# Patient Record
Sex: Female | Born: 1955 | Race: White | Hispanic: No | Marital: Married | Smoking: Never smoker
Health system: Southern US, Community
[De-identification: ages and names within clinical notes are randomized; demographics above are authoritative.]

## PROBLEM LIST (undated history)

## (undated) DIAGNOSIS — I2510 Atherosclerotic heart disease of native coronary artery without angina pectoris: Secondary | ICD-10-CM

## (undated) DIAGNOSIS — I350 Nonrheumatic aortic (valve) stenosis: Secondary | ICD-10-CM

## (undated) DIAGNOSIS — I10 Essential (primary) hypertension: Secondary | ICD-10-CM

## (undated) DIAGNOSIS — T8859XA Other complications of anesthesia, initial encounter: Secondary | ICD-10-CM

## (undated) DIAGNOSIS — R112 Nausea with vomiting, unspecified: Secondary | ICD-10-CM

## (undated) DIAGNOSIS — Z9889 Other specified postprocedural states: Secondary | ICD-10-CM

## (undated) HISTORY — PX: OTHER SURGICAL HISTORY: SHX169

## (undated) HISTORY — PX: TONSILLECTOMY: SUR1361

## (undated) HISTORY — PX: ABDOMINAL HYSTERECTOMY: SHX81

## (undated) HISTORY — PX: COLONOSCOPY: SHX174

---

## 2002-02-19 ENCOUNTER — Encounter: Admission: RE | Admit: 2002-02-19 | Discharge: 2002-02-19 | Payer: Self-pay | Admitting: Obstetrics and Gynecology

## 2002-02-19 ENCOUNTER — Encounter: Payer: Self-pay | Admitting: Obstetrics and Gynecology

## 2003-03-29 ENCOUNTER — Ambulatory Visit (HOSPITAL_COMMUNITY): Admission: RE | Admit: 2003-03-29 | Discharge: 2003-03-29 | Payer: Self-pay | Admitting: Family Medicine

## 2004-10-16 ENCOUNTER — Ambulatory Visit (HOSPITAL_COMMUNITY): Admission: RE | Admit: 2004-10-16 | Discharge: 2004-10-16 | Payer: Self-pay | Admitting: General Surgery

## 2014-02-08 ENCOUNTER — Emergency Department (HOSPITAL_COMMUNITY)
Admission: EM | Admit: 2014-02-08 | Discharge: 2014-02-08 | Disposition: A | Discharge status: Home / Self Care | Payer: BC Managed Care – PPO | Attending: Emergency Medicine | Admitting: Emergency Medicine

## 2014-02-08 ENCOUNTER — Encounter (HOSPITAL_COMMUNITY): Payer: Self-pay | Admitting: Emergency Medicine

## 2014-02-08 ENCOUNTER — Emergency Department (HOSPITAL_COMMUNITY): Payer: BC Managed Care – PPO

## 2014-02-08 DIAGNOSIS — R011 Cardiac murmur, unspecified: Secondary | ICD-10-CM | POA: Diagnosis not present

## 2014-02-08 DIAGNOSIS — Z79899 Other long term (current) drug therapy: Secondary | ICD-10-CM | POA: Insufficient documentation

## 2014-02-08 DIAGNOSIS — R079 Chest pain, unspecified: Secondary | ICD-10-CM | POA: Insufficient documentation

## 2014-02-08 DIAGNOSIS — Z7982 Long term (current) use of aspirin: Secondary | ICD-10-CM | POA: Insufficient documentation

## 2014-02-08 DIAGNOSIS — I2510 Atherosclerotic heart disease of native coronary artery without angina pectoris: Secondary | ICD-10-CM | POA: Diagnosis not present

## 2014-02-08 HISTORY — DX: Atherosclerotic heart disease of native coronary artery without angina pectoris: I25.10

## 2014-02-08 LAB — CBC WITH DIFFERENTIAL/PLATELET
BASOS PCT: 1 % (ref 0–1)
Basophils Absolute: 0.1 10*3/uL (ref 0.0–0.1)
EOS ABS: 0.2 10*3/uL (ref 0.0–0.7)
EOS PCT: 3 % (ref 0–5)
HCT: 44.5 % (ref 36.0–46.0)
Hemoglobin: 15.7 g/dL — ABNORMAL HIGH (ref 12.0–15.0)
LYMPHS ABS: 2.4 10*3/uL (ref 0.7–4.0)
Lymphocytes Relative: 30 % (ref 12–46)
MCH: 32 pg (ref 26.0–34.0)
MCHC: 35.3 g/dL (ref 30.0–36.0)
MCV: 90.6 fL (ref 78.0–100.0)
Monocytes Absolute: 0.7 10*3/uL (ref 0.1–1.0)
Monocytes Relative: 9 % (ref 3–12)
Neutro Abs: 4.8 10*3/uL (ref 1.7–7.7)
Neutrophils Relative %: 57 % (ref 43–77)
PLATELETS: 243 10*3/uL (ref 150–400)
RBC: 4.91 MIL/uL (ref 3.87–5.11)
RDW: 12.5 % (ref 11.5–15.5)
WBC: 8.2 10*3/uL (ref 4.0–10.5)

## 2014-02-08 LAB — COMPREHENSIVE METABOLIC PANEL
ALBUMIN: 4.5 g/dL (ref 3.5–5.2)
ALT: 26 U/L (ref 0–35)
AST: 23 U/L (ref 0–37)
Alkaline Phosphatase: 80 U/L (ref 39–117)
Anion gap: 15 (ref 5–15)
BILIRUBIN TOTAL: 0.7 mg/dL (ref 0.3–1.2)
BUN: 13 mg/dL (ref 6–23)
CO2: 27 mEq/L (ref 19–32)
Calcium: 10 mg/dL (ref 8.4–10.5)
Chloride: 103 mEq/L (ref 96–112)
Creatinine, Ser: 1.08 mg/dL (ref 0.50–1.10)
GFR calc Af Amer: 65 mL/min — ABNORMAL LOW (ref >90)
GFR, EST NON AFRICAN AMERICAN: 56 mL/min — AB (ref >90)
Glucose, Bld: 110 mg/dL — ABNORMAL HIGH (ref 70–99)
Potassium: 3.8 mEq/L (ref 3.7–5.3)
Sodium: 145 mEq/L (ref 137–147)
Total Protein: 8.2 g/dL (ref 6.0–8.3)

## 2014-02-08 LAB — D-DIMER, QUANTITATIVE: D-Dimer, Quant: 0.33 ug/mL-FEU (ref 0.00–0.48)

## 2014-02-08 LAB — TROPONIN I

## 2014-02-08 NOTE — Discharge Instructions (Signed)

## 2014-02-08 NOTE — ED Provider Notes (Signed)
CSN: [id number]     Arrival date & time 02/08/14  1756 History   First MD Initiated Contact with Patient 02/08/14 1859     This chart was scribed for Jane Horn, MD by Tonye Royalty, ED Scribe. This patient was seen in room APA05/APA05 and the patient's care was started at 7:02 PM.   Chief Complaint  Patient presents with  . Chest Pain   The history is provided by the patient. No language interpreter was used.   HPI Comments: Jane Conley is a 58 y.o. female who presents to the Emergency Department complaining of intermittent chest pain with onset a few weeks ago. She reports intermittent, non-radiating chest tightness that lasts a few hours and occur approximately 3 times a week; she states these episodes occur at night and are not associated with exertion. She reports an episode last night that had associated shortness of breath and diaphoresis but denies these at other occasions. She reports general fatigue for the past few months, worse today. She also reports intermittent episodes of tachycardia and skipped heartbeats for approximately 10 minutes with onset a few weeks ago. She denies history of blood clots or heart attacks. She denies fever, cough, nausea, vomiting, speech changes, or one-sided weakness.  Past Medical History  Diagnosis Date  . Coronary artery disease    Past Surgical History  Procedure Laterality Date  . Abdominal hysterectomy    . Leaking aortic valve     History reviewed. No pertinent family history. History  Substance Use Topics  . Smoking status: Never Smoker   . Smokeless tobacco: Not on file  . Alcohol Use: No   OB History   Grav Para Term Preterm Abortions TAB SAB Ect Mult Living                 Review of Systems 10 Systems reviewed and are negative for acute change except as noted in the HPI.   Allergies  Codeine  Home Medications   Prior to Admission medications   Medication Sig Start Date End Date Taking? Authorizing Provider  aspirin EC  81 MG tablet Take 81 mg by mouth every morning.   Yes Historical Provider, MD  ibuprofen (ADVIL,MOTRIN) 200 MG tablet Take 200 mg by mouth every 6 (six) hours as needed for headache.   Yes Historical Provider, MD  losartan-hydrochlorothiazide (HYZAAR) 100-25 MG per tablet Take 1 tablet by mouth daily.   Yes Historical Provider, MD  Multiple Vitamin (MULTIVITAMIN WITH MINERALS) TABS tablet Take 1 tablet by mouth every morning.   Yes Historical Provider, MD   BP 133/79  Pulse 91  Temp(Src) 98.1 F (36.7 C) (Oral)  Resp 20  Ht  (1.727 m)  Wt 200 lb (90.719 kg)  BMI 30.42 kg/m2  SpO2 99% Physical Exam  Nursing note and vitals reviewed. Constitutional:  Awake, alert, nontoxic appearance.  HENT:  Head: Atraumatic.  Eyes: Right eye exhibits no discharge. Left eye exhibits no discharge.  Neck: Neck supple.  Cardiovascular: Normal rate and regular rhythm.   Murmur (soft systolic murmur present) heard. Pulmonary/Chest: Effort normal and breath sounds normal. No respiratory distress. She has no wheezes. She has no rales. She exhibits no tenderness.  Oxygen sat normal on room air 98%  Abdominal: Soft. There is no tenderness. There is no rebound.  Musculoskeletal: She exhibits no edema and no tenderness.  Baseline ROM, no obvious new focal weakness.  Neurological:  Mental status and motor strength appears baseline for patient and situation.  Skin: No rash noted.  Psychiatric: She has a normal mood and affect.    ED Course  Procedures (including critical care time) Patient informed of clinical course, understand medical decision-making process, and agree with plan. Labs Review Labs Reviewed  CBC WITH DIFFERENTIAL - Abnormal; Notable for the following:    Hemoglobin 15.7 (*)    All other components within normal limits  COMPREHENSIVE METABOLIC PANEL - Abnormal; Notable for the following:    Glucose, Bld 110 (*)    GFR calc non Af Amer 56 (*)    GFR calc Af Amer 65 (*)    All  other components within normal limits  TROPONIN I  D-DIMER, QUANTITATIVE    Imaging Review No results found. Dg Chest 2 View  02/08/2014   CLINICAL DATA:  Chest pain.  The patient feels weak today.  EXAM: CHEST  2 VIEW  COMPARISON:  None  FINDINGS: Heart size is normal. Aorta is mildly tortuous. There are no focal consolidations or pleural effusions. No pulmonary edema. Mild upper thoracic degenerative changes are present.  IMPRESSION: No evidence for acute  abnormality.   Electronically Signed   By: Rosalie Gums M.D.   On: 02/08/2014 18:23   EKG Interpretation   Date/Time:  Monday February 08 2014 17:59:25 EDT Ventricular Rate:  103 PR Interval:  180 QRS Duration: 112 QT Interval:  370 QTC Calculation: 484 R Axis:   -58 Text Interpretation:  Sinus tachycardia Left anterior fascicular block  Left ventricular hypertrophy with repolarization abnormality Cannot rule  out Septal infarct , age undetermined No previous ECGs available Confirmed  by [facility]  MD, Jonny Ruiz ([id number]) on 02/08/2014 7:00:48 PM      MDM   Final diagnoses:  Chest pain, unspecified chest pain type   I personally performed the services described in this documentation, which was scribed in my presence. The recorded information has been reviewed and is accurate. I doubt any other EMC precluding discharge at this time including, but not necessarily limited to the following:AMI, PE, Vtach.     Jane Horn, MD 02/10/14 [phone]

## 2014-02-08 NOTE — ED Notes (Signed)
Chest pain , last night , feels weak today

## 2014-05-25 ENCOUNTER — Encounter: Payer: Self-pay | Admitting: *Deleted

## 2014-05-25 DIAGNOSIS — F329 Major depressive disorder, single episode, unspecified: Secondary | ICD-10-CM | POA: Insufficient documentation

## 2014-05-25 DIAGNOSIS — I10 Essential (primary) hypertension: Secondary | ICD-10-CM | POA: Insufficient documentation

## 2014-05-25 DIAGNOSIS — E6609 Other obesity due to excess calories: Secondary | ICD-10-CM | POA: Insufficient documentation

## 2014-05-25 DIAGNOSIS — E785 Hyperlipidemia, unspecified: Secondary | ICD-10-CM | POA: Insufficient documentation

## 2014-05-25 DIAGNOSIS — F32A Depression, unspecified: Secondary | ICD-10-CM | POA: Insufficient documentation

## 2014-05-25 DIAGNOSIS — F419 Anxiety disorder, unspecified: Secondary | ICD-10-CM

## 2014-05-25 DIAGNOSIS — J069 Acute upper respiratory infection, unspecified: Secondary | ICD-10-CM | POA: Insufficient documentation

## 2014-05-26 ENCOUNTER — Encounter: Payer: Self-pay | Admitting: Internal Medicine

## 2014-05-26 ENCOUNTER — Ambulatory Visit (INDEPENDENT_AMBULATORY_CARE_PROVIDER_SITE_OTHER): Payer: BC Managed Care – PPO | Admitting: Internal Medicine

## 2014-05-26 VITALS — BP 158/98 | HR 83 | Ht 68.0 in | Wt 209.0 lb

## 2014-05-26 DIAGNOSIS — I359 Nonrheumatic aortic valve disorder, unspecified: Secondary | ICD-10-CM

## 2014-05-26 NOTE — Patient Instructions (Signed)
Your physician wants you to follow-up in: 1 year You will receive a reminder letter in the mail two months in advance. If you don't receive a letter, please call our office to schedule the follow-up appointment.     Your physician recommends that you continue on your current medications as directed. Please refer to the Current Medication list given to you today.     Your physician has requested that you have an echocardiogram. Echocardiography is a painless test that uses sound waves to create images of your heart. It provides your doctor with information about the size and shape of your heart and how well your heart's chambers and valves are working. This procedure takes approximately one hour. There are no restrictions for this procedure.      Thank you for choosing [facility] !        

## 2014-05-26 NOTE — Progress Notes (Signed)
HPI Patinet was seen in past by J Jauca in past for murmur  Had echoes done in past  .  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  This was a couple months ago  EKG done  Abnormal  Felt due to  anxiety   Went to  ER in September.  After that got better.   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 Codeine Nausea And Vomiting and Swelling    Current Outpatient Prescriptions  Medication Sig Dispense Refill  . aspirin EC 81 MG tablet Take 81 mg by mouth every morning.    Marland Kitchen. ibuprofen (ADVIL,MOTRIN) 200 MG tablet Take 200 mg by mouth every 6 (six) hours as needed for headache.    . losartan-hydrochlorothiazide (HYZAAR) 100-25 MG per tablet Take 0.5 tablets by mouth daily.     . Multiple Vitamin (MULTIVITAMIN WITH MINERALS) TABS tablet Take 1 tablet by mouth every morning.     No current facility-administered medications for this visit.    Past Medical History  Diagnosis Date  . Coronary artery disease     Past Surgical History  Procedure Laterality Date  . Abdominal hysterectomy    . Leaking aortic valve      No family history on file.  History   Social History  . Marital Status: Married    Spouse Name: N/A    Number of Children: N/A  . Years of Education: N/A   Occupational History  . Not on file.   Social History Main Topics  . Smoking status: Never Smoker   . Smokeless tobacco: Not on file  . Alcohol Use: No  . Drug Use: No  . Sexual Activity: Yes    Birth Control/ Protection: Surgical   Other Topics Concern  . Not on file   Social History Narrative    Review of Systems:   All systems reviewed.  They are negative to the above problem except as previously stated.  Vital Signs: BP 158/98 mmHg  Pulse 83  Ht 5\' 8"  (1.727 m)  Wt 209 lb (94.802 kg)  BMI 31.79 kg/m2  Physical Exam  HEENT:  Normocephalic, atraumatic. EOMI, PERRLA.  Neck: JVP is normal.  No bruits.  Lungs: clear to auscultation. No rales no wheezes.  Heart: Regular rate and rhythm. Normal S1, S2. No S3.   Gr III/VI systolic murmur base   PMI not displaced.  Abdomen:  Supple, nontender. Normal bowel sounds. No masses. No hepatomegaly.  Extremities:   Good distal pulses throughout. No lower extremity edema.  Musculoskeletal :moving all extremities.  Neuro:   alert and oriented x3.  CN II-XII grossly intact.  EKG  02/09/14:  ST 103 bpm  LVH with repol abnormality  Assessment and Plan:  1.  AV disease.  Patient appears asymptomatic  Volume good  Needs to have echo to reeval valve WIll get old records.  2. Palpitations.  If recurs will set up for event monitor.  I am not convinced they represent a signif arrhythmia  3  HTN  BP is high  3.  HCM  LDL was 130  Patient knows she has to lose wt  Lost 50 lb in past with [employer].

## 2014-05-31 ENCOUNTER — Ambulatory Visit (HOSPITAL_COMMUNITY)
Admission: RE | Admit: 2014-05-31 | Discharge: 2014-05-31 | Disposition: A | Discharge status: Home / Self Care | Payer: BC Managed Care – PPO | Source: Ambulatory Visit | Attending: Internal Medicine | Admitting: Internal Medicine

## 2014-05-31 DIAGNOSIS — R002 Palpitations: Secondary | ICD-10-CM | POA: Diagnosis not present

## 2014-05-31 DIAGNOSIS — R011 Cardiac murmur, unspecified: Secondary | ICD-10-CM | POA: Diagnosis not present

## 2014-05-31 DIAGNOSIS — I359 Nonrheumatic aortic valve disorder, unspecified: Secondary | ICD-10-CM

## 2014-05-31 DIAGNOSIS — E669 Obesity, unspecified: Secondary | ICD-10-CM | POA: Insufficient documentation

## 2014-05-31 DIAGNOSIS — I10 Essential (primary) hypertension: Secondary | ICD-10-CM | POA: Diagnosis not present

## 2014-05-31 DIAGNOSIS — E785 Hyperlipidemia, unspecified: Secondary | ICD-10-CM | POA: Insufficient documentation

## 2014-05-31 DIAGNOSIS — I352 Nonrheumatic aortic (valve) stenosis with insufficiency: Secondary | ICD-10-CM | POA: Diagnosis present

## 2014-05-31 NOTE — Progress Notes (Signed)
  Echocardiogram 2D Echocardiogram has been performed.  Jane Conley 05/31/2014, 2:06 PM

## 2014-06-07 ENCOUNTER — Telehealth: Payer: Self-pay | Admitting: Internal Medicine

## 2014-06-07 NOTE — Telephone Encounter (Signed)
Pt calling for echo results done 05/31/14

## 2014-06-07 NOTE — Telephone Encounter (Signed)
Results of echo / tgs  °

## 2014-06-08 ENCOUNTER — Other Ambulatory Visit: Payer: Self-pay

## 2014-06-08 DIAGNOSIS — I350 Nonrheumatic aortic (valve) stenosis: Secondary | ICD-10-CM

## 2014-06-09 ENCOUNTER — Telehealth: Payer: Self-pay | Admitting: Internal Medicine

## 2014-06-09 NOTE — Telephone Encounter (Signed)
Results of echo / called on 1/11/6 / no response / tgs

## 2014-06-10 ENCOUNTER — Telehealth: Payer: Self-pay

## 2014-06-10 DIAGNOSIS — I77810 Thoracic aortic ectasia: Secondary | ICD-10-CM

## 2014-06-10 DIAGNOSIS — Z01818 Encounter for other preprocedural examination: Secondary | ICD-10-CM

## 2014-06-10 NOTE — Telephone Encounter (Signed)
Dr Tenny Crawoss resulted echo and has now sent it to us.i have LM for pt to call back

## 2014-06-10 NOTE — Telephone Encounter (Signed)
pt made aware of echo results,will need chest ct,placed lab slip for BUN/Creat,chest CT order placed

## 2014-06-10 NOTE — Telephone Encounter (Signed)
-----   Message from Pricilla RifflePaula Ross V, MD sent at 06/08/2014 10:38 AM EST ----- Aortic valve appears moderately narrowed (stenotic ) now and it has mod insufficiency. The pumping funciton of the heart is normal Above the valve the ascending aorta is dilated   Difficult to see well, measure accuratedly Would recomm CT of chest to define clearly the size of the aorta She should also have f/u in clinic in September with echo prior

## 2014-06-17 ENCOUNTER — Telehealth: Payer: Self-pay

## 2014-06-17 ENCOUNTER — Other Ambulatory Visit: Payer: Self-pay | Admitting: Internal Medicine

## 2014-06-17 NOTE — Telephone Encounter (Signed)
Pt is aware she needs to have BUN/Creat drawn before chest CT.i have spoken to patient twice now and she assures me she is going to get lab work

## 2014-06-18 LAB — BUN: BUN: 13 mg/dL (ref 6–23)

## 2014-06-18 LAB — BUN+CREAT: BUN/Creatinine Ratio: 11.3 Ratio

## 2014-06-18 LAB — CREATININE, SERUM: CREATININE: 1.15 mg/dL — AB (ref 0.50–1.10)

## 2014-06-24 ENCOUNTER — Telehealth: Payer: Self-pay

## 2014-06-24 NOTE — Telephone Encounter (Signed)
OK to have chest CT with constrast per Dr Halford Chessmanoss Terry Stewart aware to schedule

## 2014-06-24 NOTE — Telephone Encounter (Signed)
-----   Message from Nori Riisatherine A Carlton, RN sent at 06/10/2014  4:57 PM EST ----- Regarding: labs for chest ct Labs for chest ct

## 2014-07-01 ENCOUNTER — Ambulatory Visit (HOSPITAL_COMMUNITY)
Admission: RE | Admit: 2014-07-01 | Discharge: 2014-07-01 | Disposition: A | Discharge status: Home / Self Care | Payer: BC Managed Care – PPO | Source: Ambulatory Visit | Attending: Internal Medicine | Admitting: Internal Medicine

## 2014-07-01 DIAGNOSIS — I352 Nonrheumatic aortic (valve) stenosis with insufficiency: Secondary | ICD-10-CM | POA: Insufficient documentation

## 2014-07-01 DIAGNOSIS — I77810 Thoracic aortic ectasia: Secondary | ICD-10-CM

## 2014-07-01 DIAGNOSIS — R079 Chest pain, unspecified: Secondary | ICD-10-CM | POA: Diagnosis not present

## 2014-07-01 DIAGNOSIS — I712 Thoracic aortic aneurysm, without rupture: Secondary | ICD-10-CM | POA: Diagnosis present

## 2014-07-01 MED ORDER — IOHEXOL 350 MG/ML SOLN
100.0000 mL | Freq: Once | INTRAVENOUS | Status: AC | PRN
  Administered 2014-07-01: 100 mL via INTRAVENOUS

## 2014-07-05 ENCOUNTER — Telehealth: Payer: Self-pay | Admitting: Cardiovascular Disease

## 2014-07-05 DIAGNOSIS — Z01818 Encounter for other preprocedural examination: Secondary | ICD-10-CM

## 2014-07-05 DIAGNOSIS — I7121 Aneurysm of the ascending aorta, without rupture: Secondary | ICD-10-CM

## 2014-07-05 DIAGNOSIS — I712 Thoracic aortic aneurysm, without rupture: Secondary | ICD-10-CM

## 2014-07-05 NOTE — Telephone Encounter (Signed)
Test results/tgs

## 2014-07-05 NOTE — Telephone Encounter (Signed)
Forward to Dr Tenny Crawoss for results of chest CT

## 2014-07-06 NOTE — Telephone Encounter (Signed)
Pt notified, will message Sima Mataserry Stewart to schedule chest CT in October(order placed),will place order for creatinine also

## 2014-07-06 NOTE — Telephone Encounter (Signed)
Note done

## 2014-07-06 NOTE — Telephone Encounter (Signed)
Reviewed CT of chest Ascending aorta measures 4.5 cm That is mild dilation   I would recomm repeat CT next October to see for any progression.  If stable would back down a little bit on frequenc6

## 2014-07-07 ENCOUNTER — Telehealth: Payer: Self-pay | Admitting: *Deleted

## 2014-07-07 NOTE — Telephone Encounter (Signed)
Pt had test results called to her and she has some questions

## 2014-07-07 NOTE — Telephone Encounter (Signed)
Does patient need dental prophy ? If so please give order so I can call into pharmacy for her

## 2014-07-16 ENCOUNTER — Telehealth: Payer: Self-pay | Admitting: *Deleted

## 2014-07-16 MED ORDER — AMOXICILLIN 500 MG PO TABS: ORAL_TABLET | ORAL | Status: DC

## 2014-07-16 NOTE — Telephone Encounter (Signed)
Pricilla RifflePaula Ross V, MD at 07/16/2014 10:45 AM     Status: Signed       Expand All Collapse All   I would go ahead and treat. With 2 g amoxicillin as long as she has taken before and not had a problem It is being extra careful to do            Nori Riisatherine A Carlton, RN at 07/07/2014 9:56 AM     Status: Signed       Expand All Collapse All   Does patient need dental prophy ? If so please give order so I can call into pharmacy for her            San Jettyanya M Jackson at 07/07/2014 9:28 AM     Status: Signed       Expand All Collapse All   Pt had test results called to her and she has some questions

## 2014-07-16 NOTE — Telephone Encounter (Signed)
Informed patient ok to taken prophylactic antibiotic.  She will pick up at her pharmacy to have on hand.  Also asked about recent cta results.  Reviewed Dr. Charlott Rakesoss's result note with patient. She has appointment with PCP coming up and requested copy sent to him.  Routed report to Dr. Margo AyeHall.  Per patient her BP was elevated at last office visit. She started taking a whole tab of Hyzaar. Her BP is improved -120s/70s, and she notices her ankles are less swollen.  She gets a little light headed in the evenings tho.

## 2014-07-16 NOTE — Telephone Encounter (Signed)
I would go ahead and treat. With 2 g amoxicillin as long as she has taken before and not had a problem It is being extra careful to do

## 2014-07-19 NOTE — Telephone Encounter (Signed)
Keep following BP

## 2014-07-21 NOTE — Telephone Encounter (Signed)
Called patient to let her know Dr. Tenny Crawoss states to keep following BP. Pt states she went to her PCP and BP was high because she had backed dose back to 1/2 Hyzaar because she was feeling lightheaded.  He changed her medication to losartan 100 mg daily and hctz 12.5 mg as needed for swelling. Med list adjusted accordingly.

## 2015-06-27 IMAGING — CT CT ANGIO CHEST
1 of 6 series · 5 of 36 positions shown · IV contrast (Omnipaque 300)
Comparison: Results from an echocardiogram on 05/31/2014

CLINICAL DATA: Aortic valve stenosis, aortic insufficiency and
chest pain. Echocardiography demonstrated probable dilatation of the
proximal ascending aorta.

EXAM:
CT ANGIOGRAPHY CHEST WITH CONTRAST
TECHNIQUE: Multidetector CT imaging of the chest was performed using the
standard protocol during bolus administration of intravenous
contrast. Multiplanar CT image reconstructions and MIPs were
obtained to evaluate the vascular anatomy.
CONTRAST:  100mL OMNIPAQUE IOHEXOL 350 MG/ML SOLN

[Series 4: pe 3.0 b40f · axial · 0.59mm/px · z∈[-342,-166]mm · 5 of 89 slices shown]
[im 15/89  lung]
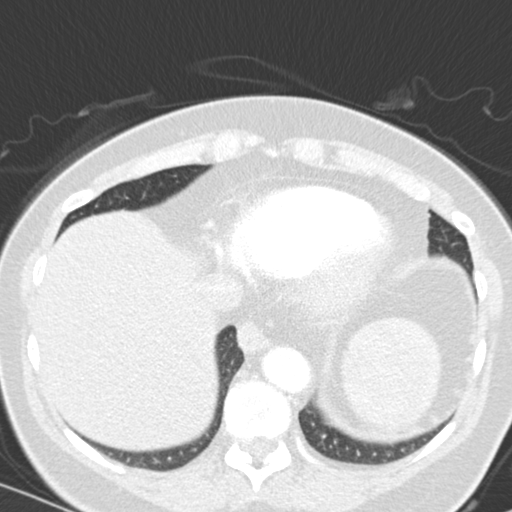
[im 30/89  mediastinal]
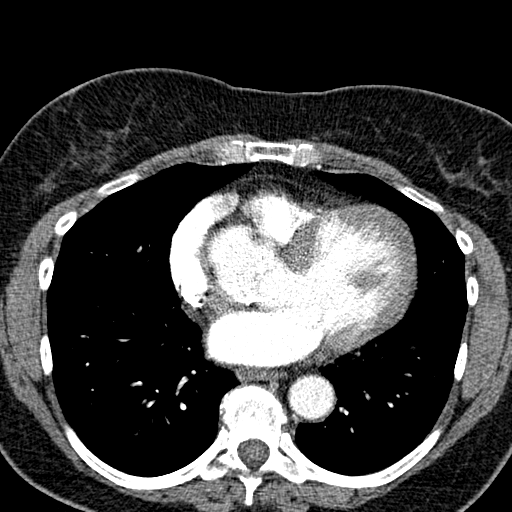
[im 45/89  lung]
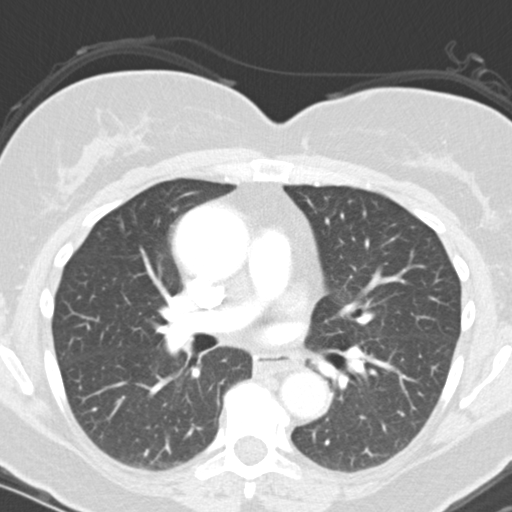
[im 59/89  mediastinal]
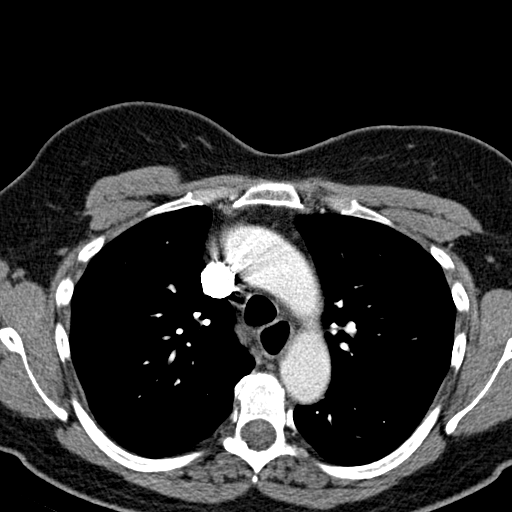
[im 74/89  lung]
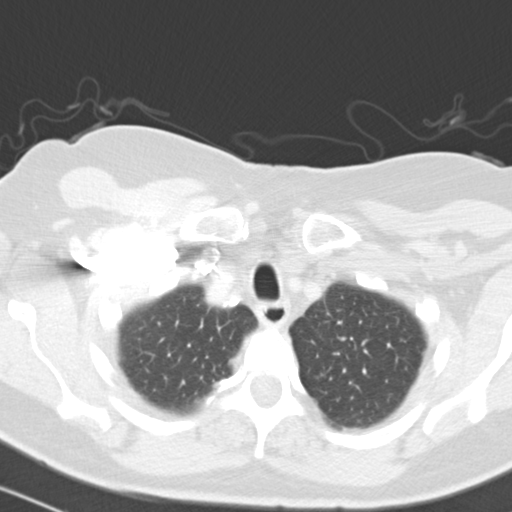

[5 of 36 positions shown; findings below may reference images not displayed]

FINDINGS: The thoracic aorta was not optimally opacified with contrast
compared to the pulmonary arteries due to the timing of the scan.
However, the aorta is felt to be adequately opacified in order to
make accurate measurements. The aortic valve is calcified.

The aortic root at the level of the sinuses of Valsalva is not
overtly dilated with maximum diameter of approximately 3.7- 3.8 cm.
The ascending thoracic aorta shows dilatation with maximal diameter
of approximately 4.5 cm. The proximal arch measures 3.2 cm. The
distal arch measures 2.9 cm. The descending thoracic aorta measures
2.6 cm.

There is no evidence of aortic dissection. Proximal great vessels
show normal branching anatomy. The pulmonary arteries are normal.
The heart size is at the upper limits of normal. No pleural or
pericardial fluid is identified. Lungs show no evidence of edema,
airspace consolidation or nodule. No enlarged lymph nodes are seen.
Visualized airways are normally patent. The upper abdomen is
unremarkable. Mild spondylosis present of the thoracic spine. There
is a small hiatal hernia.

Review of the MIP images confirms the above findings.
IMPRESSION: Aneurysmal disease of the ascending thoracic aorta with maximum
diameter of 4.5 cm. There is no overt aneurysmal dilatation at the
level of the sinuses of Valsalva.

## 2015-09-19 ENCOUNTER — Encounter: Payer: Self-pay | Admitting: Internal Medicine

## 2015-09-19 ENCOUNTER — Ambulatory Visit (INDEPENDENT_AMBULATORY_CARE_PROVIDER_SITE_OTHER): Payer: BC Managed Care – PPO | Admitting: Internal Medicine

## 2015-09-19 VITALS — BP 124/78 | HR 86 | Ht 68.0 in | Wt 193.0 lb

## 2015-09-19 DIAGNOSIS — I10 Essential (primary) hypertension: Secondary | ICD-10-CM

## 2015-09-19 DIAGNOSIS — I351 Nonrheumatic aortic (valve) insufficiency: Secondary | ICD-10-CM

## 2015-09-19 DIAGNOSIS — I350 Nonrheumatic aortic (valve) stenosis: Secondary | ICD-10-CM

## 2015-09-19 NOTE — Patient Instructions (Signed)
Your physician recommends that you schedule a follow-up appointment in: to be determined after echo    Your physician recommends that you continue on your current medications as directed. Please refer to the Current Medication list given to you today.     Your physician has requested that you have an echocardiogram. Echocardiography is a painless test that uses sound waves to create images of your heart. It provides your doctor with information about the size and shape of your heart and how well your heart's chambers and valves are working. This procedure takes approximately one hour. There are no restrictions for this procedure.    Thank you for choosing [facility] !         

## 2015-09-19 NOTE — Progress Notes (Signed)
Cardiology Office Note   Date:  09/19/2015   ID:  Jane Conley, DOB 06/19/1955, MRN [id number]  PCP:  Dwana MelenaZack Hall, MD  Cardiologist:   Dietrich PatesPaula Keyshawn Hellwig, MD    F/U of aortic valve dz   History of Present Illness: Jane Conley is a 60 y.o. female with a history of murmur  Hx of aortic regurgitation   I saw her in 2015  AV was mod stenotic (mean gradient 23)   with mod insufficiency  Aorta appeared dilated  45 mm  SInce seen,  has changed diet  Lost about 20 lbs   Breathing OK  No CP   Heart racing stopped  Stopped soft drinks      Outpatient Prescriptions Prior to Visit  Medication Sig Dispense Refill  . amoxicillin (AMOXIL) 500 MG tablet Take 4 tablets as directed prior to dental appointments 4 tablet 1  . aspirin EC 81 MG tablet Take 81 mg by mouth every morning.    . hydrochlorothiazide (MICROZIDE) 12.5 MG capsule Take 12.5 mg by mouth daily as needed.    Marland Kitchen. ibuprofen (ADVIL,MOTRIN) 200 MG tablet Take 200 mg by mouth every 6 (six) hours as needed for headache.    . Multiple Vitamin (MULTIVITAMIN WITH MINERALS) TABS tablet Take 1 tablet by mouth every morning.    Marland Kitchen. losartan (COZAAR) 100 MG tablet Take 100 mg by mouth daily.     No facility-administered medications prior to visit.     Allergies:   Codeine   No past medical history on file.  Past Surgical History  Procedure Laterality Date  . Abdominal hysterectomy    . Leaking aortic valve       Social History:  The patient  reports that she has never smoked. She does not have any smokeless tobacco history on file. She reports that she does not drink alcohol or use illicit drugs.   Family History:  The patient's family history is not on file.    ROS:  Please see the history of present illness. All other systems are reviewed and  Negative to the above problem except as noted.    PHYSICAL EXAM: VS:  BP 124/78 mmHg  Pulse 86  Ht 5\' 8"  (1.727 m)  Wt 193 lb (87.544 kg)  BMI 29.35 kg/m2  SpO2 99%  GEN: Well nourished,  well developed, in no acute distress HEENT: normal Neck: no JVD, carotid bruits, or masses Cardiac: RRR;  Gr II/VI systolic murmur  Gr I-II / VI diastolic murmur   rubs, or gallops,no edema  Respiratory:  clear to auscultation bilaterally, normal work of breathing GI: soft, nontender, nondistended, + BS  No hepatomegaly  MS: no deformity Moving all extremities   Skin: warm and dry, no rash Neuro:  Strength and sensation are intact Psych: euthymic mood, full affect   EKG:  EKG is ordered today.  SR 70 bpm  LVH     Lipid Panel No results found for: CHOL, TRIG, HDL, CHOLHDL, VLDL, LDLCALC, LDLDIRECT    Wt Readings from Last 3 Encounters:  09/19/15 193 lb (87.544 kg)  05/26/14 209 lb (94.802 kg)  05/10/14 202 lb 3.2 oz (91.717 kg)      ASSESSMENT AND PLAN:  1  AV dz  Will set for f/u echo  2.  Aortic aneurysm  Will review echo  May need to get another CT  3.  HTN  Good control  4.  HCM  Congratulated on wt loss   Will get labs  fro mDr Halls office Encouraged continued physical activity  5  Palpitations  Resolved when stopped soft drinks    F/U tentatively in 1 year       Signed, Dietrich Pates, MD  09/19/2015 9:10 AM    [facility] [address] [place], [place], [place]  [address] Phone: [phone]; Fax: [phone]

## 2015-09-20 ENCOUNTER — Ambulatory Visit (HOSPITAL_COMMUNITY)
Admission: RE | Admit: 2015-09-20 | Discharge: 2015-09-20 | Disposition: A | Discharge status: Home / Self Care | Payer: BC Managed Care – PPO | Source: Ambulatory Visit | Attending: Internal Medicine | Admitting: Internal Medicine

## 2015-09-20 DIAGNOSIS — I340 Nonrheumatic mitral (valve) insufficiency: Secondary | ICD-10-CM | POA: Diagnosis not present

## 2015-09-20 DIAGNOSIS — I77810 Thoracic aortic ectasia: Secondary | ICD-10-CM | POA: Diagnosis not present

## 2015-09-20 DIAGNOSIS — I350 Nonrheumatic aortic (valve) stenosis: Secondary | ICD-10-CM | POA: Diagnosis present

## 2015-09-20 DIAGNOSIS — I119 Hypertensive heart disease without heart failure: Secondary | ICD-10-CM | POA: Insufficient documentation

## 2015-09-20 DIAGNOSIS — E785 Hyperlipidemia, unspecified: Secondary | ICD-10-CM | POA: Diagnosis not present

## 2015-09-20 DIAGNOSIS — I352 Nonrheumatic aortic (valve) stenosis with insufficiency: Secondary | ICD-10-CM | POA: Insufficient documentation

## 2015-09-20 DIAGNOSIS — I351 Nonrheumatic aortic (valve) insufficiency: Secondary | ICD-10-CM | POA: Diagnosis not present

## 2022-05-11 LAB — COLOGUARD

## 2023-06-21 ENCOUNTER — Telehealth: Payer: Self-pay | Admitting: Nurse Practitioner

## 2023-06-21 ENCOUNTER — Telehealth: Payer: Self-pay

## 2023-06-21 DIAGNOSIS — R6889 Other general symptoms and signs: Secondary | ICD-10-CM

## 2023-06-21 MED ORDER — OSELTAMIVIR PHOSPHATE 75 MG PO CAPS: 75.0000 mg | ORAL_CAPSULE | Freq: Two times a day (BID) | ORAL | 0 refills | Status: AC

## 2023-06-21 MED ORDER — BENZONATATE 100 MG PO CAPS: 100.0000 mg | ORAL_CAPSULE | Freq: Three times a day (TID) | ORAL | 0 refills | Status: DC | PRN

## 2023-06-21 NOTE — Progress Notes (Signed)
Virtual Visit Consent   Jane Conley, you are scheduled for a virtual visit with a [facility] provider today. Just as with appointments in the office, your consent must be obtained to participate. Your consent will be active for this visit and any virtual visit you may have with one of our providers in the next 365 days. If you have a MyChart account, a copy of this consent can be sent to you electronically.  As this is a virtual visit, video technology does not allow for your provider to perform a traditional examination. This may limit your provider's ability to fully assess your condition. If your provider identifies any concerns that need to be evaluated in person or the need to arrange testing (such as labs, EKG, etc.), we will make arrangements to do so. Although advances in technology are sophisticated, we cannot ensure that it will always work on either your end or our end. If the connection with a video visit is poor, the visit may have to be switched to a telephone visit. With either a video or telephone visit, we are not always able to ensure that we have a secure connection.  By engaging in this virtual visit, you consent to the provision of healthcare and authorize for your insurance to be billed (if applicable) for the services provided during this visit. Depending on your insurance coverage, you may receive a charge related to this service.  I need to obtain your verbal consent now. Are you willing to proceed with your visit today? Jane Conley has provided verbal consent on 06/21/2023 for a virtual visit (video or telephone). Viviano Simas, FNP  Date: 06/21/2023 3:47 PM  Virtual Visit via Video Note   I, Viviano Simas, connected with  Jane Conley  ([id number], 04-Feb-1956) on 06/21/23 at  4:00 PM EST by a video-enabled telemedicine application and verified that I am speaking with the correct person using two identifiers.  Location: Patient: Virtual Visit Location Patient:  Home Provider: Virtual Visit Location Provider: Home Office   I discussed the limitations of evaluation and management by telemedicine and the availability of in person appointments. The patient expressed understanding and agreed to proceed.    History of Present Illness: Jane Conley is a 68 y.o. who identifies as a female who was assigned female at birth, and is being seen today for flu like symptoms.  Around 10pm last night she had onset of fever and cough  Also has body aches   She was with three family members at [facility] that have since tested positive for the flu (A)  She did not have a flu shot this year   She does not have an appetite   Problems:  Patient Active Problem List   Diagnosis Date Noted   Hyperlipidemia 05/25/2014   HTN (hypertension) 05/25/2014   Depression, acute 05/25/2014   Anxiety disorder 05/25/2014   Obesity due to excess calories 05/25/2014   Acute upper respiratory infection 05/25/2014    Allergies:  Allergies  Allergen Reactions   Codeine Nausea And Vomiting and Swelling   Medications:  Current Outpatient Medications:    amoxicillin (AMOXIL) 500 MG tablet, Take 4 tablets as directed prior to dental appointments, Disp: 4 tablet, Rfl: 1   aspirin EC 81 MG tablet, Take 81 mg by mouth every morning., Disp: , Rfl:    hydrochlorothiazide (MICROZIDE) 12.5 MG capsule, Take 12.5 mg by mouth daily as needed., Disp: , Rfl:    ibuprofen (ADVIL,MOTRIN) 200 MG tablet,  Take 200 mg by mouth every 6 (six) hours as needed for headache., Disp: , Rfl:    losartan-hydrochlorothiazide (HYZAAR) 50-12.5 MG tablet, , Disp: , Rfl:    Multiple Vitamin (MULTIVITAMIN WITH MINERALS) TABS tablet, Take 1 tablet by mouth every morning., Disp: , Rfl:   Observations/Objective: Patient is well-developed, well-nourished in no acute distress.  Resting comfortably  at home.  Head is normocephalic, atraumatic.  No labored breathing.  Speech is clear and coherent with logical content.   Patient is alert and oriented at baseline.    Assessment and Plan:  1. Flu-like symptoms (Primary)  Take anti-viral with food   - oseltamivir (TAMIFLU) 75 MG capsule; Take 1 capsule (75 mg total) by mouth 2 (two) times daily for 5 days.  Dispense: 10 capsule; Refill: 0  - benzonatate (TESSALON) 100 MG capsule; Take 1 capsule (100 mg total) by mouth 3 (three) times daily as needed.  Dispense: 30 capsule; Refill: 0    Follow Up Instructions: I discussed the assessment and treatment plan with the patient. The patient was provided an opportunity to ask questions and all were answered. The patient agreed with the plan and demonstrated an understanding of the instructions.  A copy of instructions were sent to the patient via MyChart unless otherwise noted below.    The patient was advised to call back or seek an in-person evaluation if the symptoms worsen or if the condition fails to improve as anticipated.    Viviano Simas, FNP

## 2023-06-25 DIAGNOSIS — J019 Acute sinusitis, unspecified: Secondary | ICD-10-CM | POA: Diagnosis not present

## 2023-06-25 DIAGNOSIS — R0600 Dyspnea, unspecified: Secondary | ICD-10-CM | POA: Diagnosis not present

## 2023-06-25 DIAGNOSIS — I10 Essential (primary) hypertension: Secondary | ICD-10-CM | POA: Diagnosis not present

## 2023-06-25 DIAGNOSIS — R059 Cough, unspecified: Secondary | ICD-10-CM | POA: Diagnosis not present

## 2023-06-25 DIAGNOSIS — I350 Nonrheumatic aortic (valve) stenosis: Secondary | ICD-10-CM | POA: Diagnosis not present

## 2024-04-26 ENCOUNTER — Inpatient Hospital Stay (HOSPITAL_COMMUNITY)
Admission: EM | Admit: 2024-04-26 | Discharge: 2024-04-28 | DRG: 286 | Disposition: A | Attending: Internal Medicine | Admitting: Internal Medicine

## 2024-04-26 ENCOUNTER — Emergency Department (HOSPITAL_COMMUNITY)

## 2024-04-26 ENCOUNTER — Encounter (HOSPITAL_COMMUNITY): Admission: EM | Disposition: A | Payer: Self-pay | Source: Home / Self Care | Attending: Internal Medicine

## 2024-04-26 ENCOUNTER — Other Ambulatory Visit: Payer: Self-pay

## 2024-04-26 DIAGNOSIS — I272 Pulmonary hypertension, unspecified: Secondary | ICD-10-CM | POA: Diagnosis present

## 2024-04-26 DIAGNOSIS — E876 Hypokalemia: Secondary | ICD-10-CM | POA: Diagnosis present

## 2024-04-26 DIAGNOSIS — N1831 Chronic kidney disease, stage 3a: Secondary | ICD-10-CM | POA: Diagnosis present

## 2024-04-26 DIAGNOSIS — Z79899 Other long term (current) drug therapy: Secondary | ICD-10-CM

## 2024-04-26 DIAGNOSIS — I10 Essential (primary) hypertension: Secondary | ICD-10-CM | POA: Diagnosis present

## 2024-04-26 DIAGNOSIS — I5043 Acute on chronic combined systolic (congestive) and diastolic (congestive) heart failure: Secondary | ICD-10-CM | POA: Diagnosis present

## 2024-04-26 DIAGNOSIS — I7121 Aneurysm of the ascending aorta, without rupture: Secondary | ICD-10-CM | POA: Diagnosis present

## 2024-04-26 DIAGNOSIS — I5031 Acute diastolic (congestive) heart failure: Secondary | ICD-10-CM | POA: Diagnosis not present

## 2024-04-26 DIAGNOSIS — R7989 Other specified abnormal findings of blood chemistry: Secondary | ICD-10-CM

## 2024-04-26 DIAGNOSIS — I712 Thoracic aortic aneurysm, without rupture, unspecified: Secondary | ICD-10-CM

## 2024-04-26 DIAGNOSIS — I130 Hypertensive heart and chronic kidney disease with heart failure and stage 1 through stage 4 chronic kidney disease, or unspecified chronic kidney disease: Principal | ICD-10-CM | POA: Diagnosis present

## 2024-04-26 DIAGNOSIS — I352 Nonrheumatic aortic (valve) stenosis with insufficiency: Secondary | ICD-10-CM | POA: Diagnosis present

## 2024-04-26 DIAGNOSIS — I110 Hypertensive heart disease with heart failure: Secondary | ICD-10-CM | POA: Diagnosis not present

## 2024-04-26 DIAGNOSIS — R0602 Shortness of breath: Secondary | ICD-10-CM | POA: Diagnosis not present

## 2024-04-26 DIAGNOSIS — R778 Other specified abnormalities of plasma proteins: Secondary | ICD-10-CM | POA: Diagnosis not present

## 2024-04-26 DIAGNOSIS — I509 Heart failure, unspecified: Principal | ICD-10-CM

## 2024-04-26 DIAGNOSIS — R9431 Abnormal electrocardiogram [ECG] [EKG]: Secondary | ICD-10-CM | POA: Diagnosis not present

## 2024-04-26 DIAGNOSIS — I350 Nonrheumatic aortic (valve) stenosis: Secondary | ICD-10-CM | POA: Diagnosis not present

## 2024-04-26 DIAGNOSIS — I160 Hypertensive urgency: Secondary | ICD-10-CM | POA: Diagnosis present

## 2024-04-26 DIAGNOSIS — R002 Palpitations: Secondary | ICD-10-CM | POA: Diagnosis present

## 2024-04-26 DIAGNOSIS — I2489 Other forms of acute ischemic heart disease: Secondary | ICD-10-CM | POA: Diagnosis present

## 2024-04-26 DIAGNOSIS — Z91148 Patient's other noncompliance with medication regimen for other reason: Secondary | ICD-10-CM

## 2024-04-26 DIAGNOSIS — I517 Cardiomegaly: Secondary | ICD-10-CM | POA: Diagnosis not present

## 2024-04-26 DIAGNOSIS — Z885 Allergy status to narcotic agent status: Secondary | ICD-10-CM

## 2024-04-26 LAB — CBC
HCT: 47.2 % — ABNORMAL HIGH (ref 36.0–46.0)
Hemoglobin: 15.7 g/dL — ABNORMAL HIGH (ref 12.0–15.0)
MCH: 32.2 pg (ref 26.0–34.0)
MCHC: 33.3 g/dL (ref 30.0–36.0)
MCV: 96.7 fL (ref 80.0–100.0)
Platelets: 232 K/uL (ref 150–400)
RBC: 4.88 MIL/uL (ref 3.87–5.11)
RDW: 12.6 % (ref 11.5–15.5)
WBC: 10.5 K/uL (ref 4.0–10.5)
nRBC: 0 % (ref 0.0–0.2)

## 2024-04-26 LAB — I-STAT VENOUS BLOOD GAS, ED
Acid-base deficit: 3 mmol/L — ABNORMAL HIGH (ref 0.0–2.0)
Bicarbonate: 20.6 mmol/L (ref 20.0–28.0)
Calcium, Ion: 0.99 mmol/L — ABNORMAL LOW (ref 1.15–1.40)
HCT: 46 % (ref 36.0–46.0)
Hemoglobin: 15.6 g/dL — ABNORMAL HIGH (ref 12.0–15.0)
O2 Saturation: 92 %
Potassium: 3.4 mmol/L — ABNORMAL LOW (ref 3.5–5.1)
Sodium: 138 mmol/L (ref 135–145)
TCO2: 22 mmol/L (ref 22–32)
pCO2, Ven: 32.1 mmHg — ABNORMAL LOW (ref 44–60)
pH, Ven: 7.416 (ref 7.25–7.43)
pO2, Ven: 61 mmHg — ABNORMAL HIGH (ref 32–45)

## 2024-04-26 LAB — BASIC METABOLIC PANEL WITH GFR
Anion gap: 12 (ref 5–15)
BUN: 14 mg/dL (ref 8–23)
CO2: 19 mmol/L — ABNORMAL LOW (ref 22–32)
Calcium: 8.8 mg/dL — ABNORMAL LOW (ref 8.9–10.3)
Chloride: 106 mmol/L (ref 98–111)
Creatinine, Ser: 1.03 mg/dL — ABNORMAL HIGH (ref 0.44–1.00)
GFR, Estimated: 59 mL/min — ABNORMAL LOW (ref >60)
Glucose, Bld: 115 mg/dL — ABNORMAL HIGH (ref 70–99)
Potassium: 3.4 mmol/L — ABNORMAL LOW (ref 3.5–5.1)
Sodium: 137 mmol/L (ref 135–145)

## 2024-04-26 LAB — PROTIME-INR
INR: 1 (ref 0.8–1.2)
Prothrombin Time: 13.7 s (ref 11.4–15.2)

## 2024-04-26 LAB — HEPATIC FUNCTION PANEL
ALT: 24 U/L (ref 0–44)
AST: 28 U/L (ref 15–41)
Albumin: 3.8 g/dL (ref 3.5–5.0)
Alkaline Phosphatase: 68 U/L (ref 38–126)
Bilirubin, Direct: 0.2 mg/dL (ref 0.0–0.2)
Indirect Bilirubin: 0.9 mg/dL (ref 0.3–0.9)
Total Bilirubin: 1.1 mg/dL (ref 0.0–1.2)
Total Protein: 7.1 g/dL (ref 6.5–8.1)

## 2024-04-26 LAB — TROPONIN I (HIGH SENSITIVITY): Troponin I (High Sensitivity): 36 ng/L — ABNORMAL HIGH (ref <18)

## 2024-04-26 LAB — LIPASE, BLOOD: Lipase: 28 U/L (ref 11–51)

## 2024-04-26 LAB — BRAIN NATRIURETIC PEPTIDE: B Natriuretic Peptide: 813.5 pg/mL — ABNORMAL HIGH (ref 0.0–100.0)

## 2024-04-26 SURGERY — CORONARY/GRAFT ACUTE MI REVASCULARIZATION
Anesthesia: LOCAL

## 2024-04-26 MED ORDER — HYDRALAZINE HCL 20 MG/ML IJ SOLN
10.0000 mg | Freq: Once | INTRAMUSCULAR | Status: AC
  Administered 2024-04-26: 10 mg via INTRAVENOUS
  Filled 2024-04-26: qty 1

## 2024-04-26 MED ORDER — ALUM & MAG HYDROXIDE-SIMETH 200-200-20 MG/5ML PO SUSP
30.0000 mL | Freq: Once | ORAL | Status: AC
  Administered 2024-04-26: 30 mL via ORAL
  Filled 2024-04-26: qty 30

## 2024-04-26 NOTE — Significant Event (Signed)
    BRIEF IC CARDIOLOGY NOTE  Code STEMI was called by [facility] on this patient who has not been seen by cardiology since 2017.  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  GR 1 DD.  Septal bounce/dyssynergy from conduction delay.  Aortic valve moderate calcified leaflets with moderate stenosis mean gradient was 23 mmHg. She also carries diagnosis of hypertension but has not been on any hypertension medications.  She presents now with what is mostly worsening dyspnea and orthopnea but this is been going on for several months just came to ahead over the last couple days.  She will get exertional dyspnea and occasional get discomfort in her epigastric area with exertion that is occasionally present now.  On EMS arrival she was dyspneic and orthopneic and profoundly hypertensive.  EKG was tachycardia with T wave inversions in lateral leads and was apparently hyperacute appearing ST segments in the inferior and lateral leads.  Code STEMI was called although she was not actually having anginal chest pain symptoms.  Most notable symptom was dyspnea.  She did look relatively comfortable on arrival just somewhat scared.  She has a harsh 4/6 SEM and at least 1-2/6 DM heard at the RUSB radiating to the carotids.  I suspect that she probably has worsening aortic stenosis and her presentation is more consistent with acute diastolic heart failure.  Bedside echocardiogram was very difficult to visualize the valve, but she did not appear to be having any regional wall motion normalities consistent with inferior lateral STEMI.  We therefore chose to cancel STEMI but will continue to follow with recommendations to assess the thoracic aorta with a CT angiogram of the chest abdomen pelvis to look for any potential aortic dissection.  She will need an echocardiogram rule out MI with troponins.  She will need blood pressure and heart rate control.  Full  consult note to follow.  After discussion with the EDP and on-call fellow, we decided to cancel code STEMI.     Alm MICAEL Clay, MD, MS Alm Clay, M.D., M.S. Interventional Cardiologist  [facility] Pager # [phone]

## 2024-04-26 NOTE — ED Provider Notes (Signed)
 [place] EMERGENCY DEPARTMENT AT [facility] Provider Note   CSN: [id number] Arrival date & time: 04/26/24  2205     Patient presents with: Code STEMI   Jane Conley is a 68 y.o. female.   The history is provided by the patient and medical records.   68 y.o. F with hx of anxiety, depression, HTN, HLP, aortic stenosis, presenting to the ED as a CODE STEMI.  Reports for the past several weeks she has been having lower chest/epigastric pain and shortness of breath.  This is notably worse when trying to exert herself, notably walking up inclines seems to exacerbate her symptoms.  States sometimes at night when lying flat she does notice a lot of congestion and a wheezing sensation as well.  She denies any fever or chills.  No sick contacts.  Per husband at bedside, she is noncompliant with her hypertensive medications because she does not really want to see the doctor frequently.  She was previously followed by cardiology, Dr. Okey but has not seen her in several years.  She does have a known history of aortic valve issues.  Prior to Admission medications   Medication Sig Start Date End Date Taking? Authorizing Provider  benzonatate  (TESSALON ) 100 MG capsule Take 1 capsule (100 mg total) by mouth 3 (three) times daily as needed. 06/21/23   Kennyth Domino, FNP  losartan-hydrochlorothiazide (HYZAAR) 50-12.5 MG tablet  09/01/15   [provider]  Multiple Vitamin (MULTIVITAMIN WITH MINERALS) TABS tablet Take 1 tablet by mouth every morning.    [provider]    Allergies: Codeine    Review of Systems  Respiratory:  Positive for shortness of breath.   Cardiovascular:  Positive for chest pain.  All other systems reviewed and are negative.   Updated Vital Signs BP 106/60   Pulse 88   Temp (!) 97.2 F (36.2 C) (Temporal)   Resp 20   SpO2 96%   Physical Exam Vitals and nursing note reviewed.  Constitutional:      Appearance: She is well-developed.  HENT:      Head: Normocephalic and atraumatic.  Eyes:     Conjunctiva/sclera: Conjunctivae normal.     Pupils: Pupils are equal, round, and reactive to light.  Cardiovascular:     Rate and Rhythm: Normal rate and regular rhythm.     Heart sounds: Murmur heard.  Pulmonary:     Effort: Pulmonary effort is normal.     Breath sounds: Normal breath sounds.     Comments: No acute distress, does seem to have orthopnea when lying flat, improves when sitting upright Abdominal:     General: Bowel sounds are normal.     Palpations: Abdomen is soft.  Musculoskeletal:        General: Normal range of motion.     Cervical back: Normal range of motion.  Skin:    General: Skin is warm and dry.  Neurological:     Mental Status: She is alert and oriented to person, place, and time.     (all labs ordered are listed, but only abnormal results are displayed) Labs Reviewed  BASIC METABOLIC PANEL WITH GFR - Abnormal; Notable for the following components:      Result Value   Potassium 3.4 (*)    CO2 19 (*)    Glucose, Bld 115 (*)    Creatinine, Ser 1.03 (*)    Calcium  8.8 (*)    GFR, Estimated 59 (*)    All other components  within normal limits  CBC - Abnormal; Notable for the following components:   Hemoglobin 15.7 (*)    HCT 47.2 (*)    All other components within normal limits  BRAIN NATRIURETIC PEPTIDE - Abnormal; Notable for the following components:   B Natriuretic Peptide 813.5 (*)    All other components within normal limits  I-STAT VENOUS BLOOD GAS, ED - Abnormal; Notable for the following components:   pCO2, Ven 32.1 (*)    pO2, Ven 61 (*)    Acid-base deficit 3.0 (*)    Potassium 3.4 (*)    Calcium , Ion 0.99 (*)    Hemoglobin 15.6 (*)    All other components within normal limits  TROPONIN I (HIGH SENSITIVITY) - Abnormal; Notable for the following components:   Troponin I (High Sensitivity) 36 (*)    All other components within normal limits  PROTIME-INR  HEPATIC FUNCTION PANEL   LIPASE, BLOOD  TROPONIN I (HIGH SENSITIVITY)    EKG: None  Radiology: CT Angio Chest/Abd/Pel for Dissection W and/or Wo Contrast Result Date: 04/27/2024 EXAM: CTA CHEST, ABDOMEN AND PELVIS WITHOUT AND WITH CONTRAST 04/27/2024 12:02:00 AM TECHNIQUE: CTA of the chest was performed without and with the administration of intravenous contrast. 100 mL (iohexol  (OMNIPAQUE ) 350 MG/ML injection 100 mL IOHEXOL  350 MG/ML SOLN) was administered. CTA of the abdomen and pelvis was performed with the administration of intravenous contrast. Multiplanar reformatted images are provided for review. MIP images are provided for review. Automated exposure control, iterative reconstruction, and/or weight based adjustment of the mA/kV was utilized to reduce the radiation dose to as low as reasonably achievable. COMPARISON: Same day x-ray. CLINICAL HISTORY: Aortic aneurysm suspected. FINDINGS: VASCULATURE: AORTA: Ascending aortic aneurysm measuring 44 mm in maximum diameter. No dissection, intramural hematoma, or penetrating atherosclerotic ulcer. No abdominal aortic aneurysm. PULMONARY ARTERIES: No pulmonary embolism with the limits of this exam. GREAT VESSELS OF AORTIC ARCH: No acute finding. No dissection. No arterial occlusion or significant stenosis. CELIAC TRUNK: No acute finding. No occlusion or significant stenosis. SUPERIOR MESENTERIC ARTERY: No acute finding. No occlusion or significant stenosis. INFERIOR MESENTERIC ARTERY: No acute finding. No occlusion or significant stenosis. RENAL ARTERIES: No acute finding. No occlusion or significant stenosis. ILIAC ARTERIES: No acute finding. No occlusion or significant stenosis. CHEST: MEDIASTINUM: Cardiomegaly. Aortic valve calcifications. No mediastinal lymphadenopathy. The pericardium demonstrates no acute abnormality. LUNGS AND PLEURA: Small bilateral pleural effusions. Patchy bilateral ground glass opacities and diffuse interlobular septal thickening. No pneumothorax.  THORACIC BONES AND SOFT TISSUES: No acute bone or soft tissue abnormality. ABDOMEN AND PELVIS: LIVER: The liver is unremarkable. GALLBLADDER AND BILE DUCTS: Cholelithiasis. No evidence of acute cholecystitis. No biliary ductal dilatation. SPLEEN: The spleen is unremarkable. PANCREAS: The pancreas is unremarkable. ADRENAL GLANDS: Bilateral adrenal glands demonstrate no acute abnormality. KIDNEYS, URETERS AND BLADDER: No stones in the kidneys or ureters. No hydronephrosis. No perinephric or periureteral stranding. Urinary bladder is unremarkable. GI AND BOWEL: Stomach and duodenal sweep demonstrate no acute abnormality. Diverticulosis without diverticulitis. Normal appendix. There is no bowel obstruction. No abnormal bowel wall thickening or distension. REPRODUCTIVE: Reproductive organs are unremarkable. PERITONEUM AND RETROPERITONEUM: No ascites or free air. LYMPH NODES: No lymphadenopathy. ABDOMINAL BONES AND SOFT TISSUES: No acute abnormality of the bones. No acute soft tissue abnormality. IMPRESSION: 1. Findings compatible with CHF with pulmonary edema and small bilateral pleural effusions. 2. Ascending aortic aneurysm measuring 44 mm in maximum diameter. Recommend annual imaging followup by CTA or MRA. This recommendation follows 2010 ACCF/AHA/AATS/ACR/ASA/SCA/SCAI/SIR/STS/SVM Guidelines for  the Diagnosis and Management of Patients with Thoracic Aortic Disease. Circulation. 2010; 121: Z733-z630. Aortic aneurysm NOS (ICD10-I71.9) Electronically signed by: Norman Gatlin MD 04/27/2024 12:25 AM EST RP Workstation: [id number]   DG Chest 1 View Result Date: 04/26/2024 EXAM: 1 VIEW(S) XRAY OF THE CHEST 04/26/2024 10:58:00 PM COMPARISON: None available. CLINICAL HISTORY: Breath, shortness. FINDINGS: LUNGS AND PLEURA: No focal pulmonary opacity. No pleural effusion. No pneumothorax. HEART AND MEDIASTINUM: Cardiomegaly. Prominent right heart border suspicious for ascending aortic aneurysm. BONES AND SOFT TISSUES: No  acute osseous abnormality. IMPRESSION: 1. Prominent right heart border suspicious for ascending aortic aneurysm. Further evaluation with CTA of the chest is recommended. Electronically signed by: Norman Gatlin MD 04/26/2024 11:10 PM EST RP Workstation: [id number]     Procedures   CRITICAL CARE Performed by: Olam CHRISTELLA Slocumb   Total critical care time: 45 minutes  Critical care time was exclusive of separately billable procedures and treating other patients.  Critical care was necessary to treat or prevent imminent or life-threatening deterioration.  Critical care was time spent personally by me on the following activities: development of treatment plan with patient and/or surrogate as well as nursing, discussions with consultants, evaluation of patient's response to treatment, examination of patient, obtaining history from patient or surrogate, ordering and performing treatments and interventions, ordering and review of laboratory studies, ordering and review of radiographic studies, pulse oximetry and re-evaluation of patient's condition.   Medications Ordered in the ED  hydrALAZINE  (APRESOLINE ) injection 10 mg (10 mg Intravenous Given 04/26/24 2230)  alum & mag hydroxide-simeth (MAALOX/MYLANTA) 200-200-20 MG/5ML suspension 30 mL (30 mLs Oral Given 04/26/24 2232)  iohexol  (OMNIPAQUE ) 350 MG/ML injection 100 mL (100 mLs Intravenous Contrast Given 04/27/24 0015)  furosemide  (LASIX ) injection 40 mg (40 mg Intravenous Given 04/27/24 0052)                                    Medical Decision Making Amount and/or Complexity of Data Reviewed Labs: ordered. Radiology: ordered and independent interpretation performed. ECG/medicine tests: ordered and independent interpretation performed.  Risk OTC drugs. Prescription drug management. Decision regarding hospitalization.   68 year old female presenting to the ED as a code STEMI.  Evaluated on arrival with cardiology fellow and attending.   She is hemodynamically stable but hypertensive.  Recently has been having epigastric/lower chest pain for the past few weeks with exertional SOB.  Multiple EKG's performed, more consistent with LVH per cardiology.  She does have known aortic valve issues.  Plan for labs here, VBG, CT angio to rule out dissection/PE given nature of her symptoms.  Hydralazine  to be given for BP.  Labs as above--no leukocytosis, stable renal function.  Troponin 36.  BNP 813.  Chest x-ray with findings of possible aneurysm.  CT dissection study had already been ordered, this does show ascending aortic aneurysm but no findings of acute rupture.  Also has pulmonary edema consistent with CHF.  Suspect her troponin elevation is due to demand ischemia.  She is given dose of IV Lasix .  Blood pressure has improved with hydralazine  given here in the ER.  Results relayed to cardiology, medicine to admit and they will continue to follow.  Formal echo in the morning.  Discussed with hospitalist, Dr. Allana-- will admit for ongoing care.  Final diagnoses:  Acute congestive heart failure, unspecified heart failure type (HCC)  Elevated troponin  Non compliance w medication regimen    ED Discharge Orders  None          Jarold Olam HERO, PA-C 04/27/24 0100    Armenta Canning, MD 05/02/24 [phone]

## 2024-04-26 NOTE — Consult Note (Addendum)
 Cardiology Consultation   Patient ID: Jane Conley MRN: [id number]; DOB: Feb 17, 1956  Admit date: 04/26/2024 Date of Consult: 04/26/2024  PCP:  Jane Norleen PEDLAR, MD   [facility] Providers Cardiologist:  None     Patient Profile: Jane Conley is a 68 y.o. female with a hx of hypertension, aortic stenosis, aortic regurgitation, ascending aortic aneurysm who is being seen 04/26/2024 for the evaluation of STEMI at the request of emergency medicine.  History of Present Illness: Jane Conley has been having exertional abdominal pressure and dyspnea for the past month.  Earlier tonight she developed similar symptoms while resting at home which prompted her to call 911.  She was brought to [facility] ED by EMS.  She complained of dyspnea and upper abdominal pain.  She was loaded with aspirin 324 mg by EMS.  Her blood pressure in the ED was 170s/110s.  She was satting appropriately on room air.  At the time of our conversation she denied chest pain.  Twelve-lead ECG shows intraventricular conduction delay with QRS 137, PR interval likely consistent with first-degree AV block, LVH and LAD.  There was no evidence of ST elevations on my assessment.  There is no evidence of atrial fibrillation on my read.  Her prior cardiac imaging was 5+ years ago.  My bedside echo showed grossly normal LV function, no evidence of cardial effusion, and no evidence of regional wall motion abnormalities.  Dr. Anner was at bedside and STEMI activation was canceled.   Patient still denied chest pain and noted that she has not been taking any medications at home.  She otherwise denies syncope, presyncope, palpitations, orthopnea, paroxysmal nocturnal dyspnea.  No past medical history on file.  Past Surgical History:  Procedure Laterality Date   ABDOMINAL HYSTERECTOMY     leaking aortic valve       Home Medications:  Prior to Admission medications   Medication Sig Start Date End Date Taking? Authorizing Provider   benzonatate  (TESSALON ) 100 MG capsule Take 1 capsule (100 mg total) by mouth 3 (three) times daily as needed. 06/21/23   Kennyth Domino, FNP  losartan-hydrochlorothiazide (HYZAAR) 50-12.5 MG tablet  09/01/15   [provider]  Multiple Vitamin (MULTIVITAMIN WITH MINERALS) TABS tablet Take 1 tablet by mouth every morning.    [provider]    Scheduled Meds:  Continuous Infusions:  PRN Meds:   Allergies:    Allergies  Allergen Reactions   Codeine Nausea And Vomiting and Swelling    Social History:   Social History   Socioeconomic History   Marital status: Married    Spouse name: Not on file   Number of children: Not on file   Years of education: Not on file   Highest education level: Not on file  Occupational History   Not on file  Tobacco Use   Smoking status: Never   Smokeless tobacco: Not on file  Substance and Sexual Activity   Alcohol use: No    Alcohol/week: 0.0 standard drinks of alcohol   Drug use: No   Sexual activity: Yes    Birth control/protection: Surgical  Other Topics Concern   Not on file  Social History Narrative   Not on file   Social Drivers of Health   Financial Resource Strain: Not on file  Food Insecurity: Not on file  Transportation Needs: Not on file  Physical Activity: Not on file  Stress: Not on file  Social Connections: Not on file  Intimate Partner Violence:  Not on file    Family History:   No family history on file.   ROS:  Please see the history of present illness. All other ROS reviewed and negative.     Physical Exam/Data: Vitals:   04/26/24 2215 04/26/24 2215 04/26/24 2215 04/26/24 2230  BP:   (!) 186/91 (!) 136/110  Pulse: 92   86  Resp: (!) 27   20  Temp:  (!) 97.2 F (36.2 C)    TempSrc:  Temporal    SpO2: 97%   95%   No intake or output data in the 24 hours ending 04/26/24 2234    09/19/2015    8:56 AM 05/26/2014    8:41 AM 05/10/2014    9:15 AM  Last 3 Weights  Weight (lbs) 193 lb 209 lb  202 lb 3.2 oz  Weight (kg) 87.544 kg 94.802 kg 91.717 kg     There is no height or weight on file to calculate BMI.  General:  Well nourished, well developed, in no acute distress HEENT: normal Neck: no JVD Vascular: No carotid bruits; Distal pulses 2+ bilaterally Cardiac:  normal S1, S2; RRR; V/VI RUSB crescendo decrescendo murmur also noted over LVOT with radiation to carotids bilaterally.  Also noted II/VI decrescendo diastolic murmur over LVOT Lungs:  clear to auscultation bilaterally, no wheezing, rhonchi or rales  Abd: soft, nontender, no hepatomegaly  Ext: no edema Musculoskeletal:  No deformities, BUE and BLE strength normal and equal Skin: warm and dry  Neuro:  CNs 2-12 intact, no focal abnormalities noted Psych:  Normal affect   EKG:  The EKG was personally reviewed and demonstrates:    Laboratory Data: High Sensitivity Troponin:  No results for input(s): TROPONINIHS in the last 720 hours.   Chemistry Recent Labs  Lab 04/26/24 2224  NA 138  K 3.4*    No results for input(s): PROT, ALBUMIN, AST, ALT, ALKPHOS, BILITOT in the last 168 hours. Lipids No results for input(s): CHOL, TRIG, HDL, LABVLDL, LDLCALC, CHOLHDL in the last 168 hours.  Hematology Recent Labs  Lab 04/26/24 2214 04/26/24 2224  WBC 10.5  --   RBC 4.88  --   HGB 15.7* 15.6*  HCT 47.2* 46.0  MCV 96.7  --   MCH 32.2  --   MCHC 33.3  --   RDW 12.6  --   PLT 232  --    Thyroid No results for input(s): TSH, FREET4 in the last 168 hours.  BNPNo results for input(s): BNP, PROBNP in the last 168 hours.  DDimer No results for input(s): DDIMER in the last 168 hours.  Radiology/Studies:  No results found.   Assessment and Plan: This is a pleasant 68 year old woman who presents to [facility] health ED as a STEMI activation.  There is no evidence of ST elevation on multiple twelve-lead ECGs and STEMI code has been canceled.  Physical exam findings are consistent with  likely severe aortic stenosis and some degree of aortic regurgitation.  The presumed etiology of her exertional abdominal discomfort and dyspnea are poorly controlled hypertension in the setting of likely severe aortic stenosis. Given her prior history of ascending aortic aneurysm our ED colleagues have ordered CT chest with contrast with triple rule out protocol.  Of note, blood pressures in both arms were similar and there is no evidence of pericardial effusion on bedside echo.  ED colleagues order hydralazine for acute management of blood pressure.  The ED team has ordered blood gas, troponins, CBC, CMP, proBNP, lipase  and abdominal CT.  Cardiology will continue to follow and will be available for consultation as the initial results come in.  Risk Assessment/Risk Scores:    TIMI Risk Score for ST  Elevation MI:   The patient's TIMI risk score is  , which indicates a  % risk of all cause mortality at 30 days.          For questions or updates, please contact [facility] Please consult [email or web address] for contact info under    Signed, Donley LOISE Devonshire, MD  04/26/2024 10:34 PM  BRIEF IC CARDIOLOGY NOTE   Code STEMI was called by [facility] EMS on this patient who has not been seen by cardiology since 2017.  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  GR 1 DD.  Septal bounce/dyssynergy from conduction delay.  Aortic valve moderate calcified leaflets with moderate stenosis mean gradient was 23 mmHg. She also carries diagnosis of hypertension but has not been on any hypertension medications.  She presents now with what is mostly worsening dyspnea and orthopnea but this is been going on for several months just came to ahead over the last couple days.  She will get exertional dyspnea and occasional get discomfort in her epigastric area with exertion that is occasionally present now.   On EMS arrival she was dyspneic and  orthopneic and profoundly hypertensive.  EKG was tachycardia with T wave inversions in lateral leads and was apparently hyperacute appearing ST segments in the inferior and lateral leads.  Code STEMI was called although she was not actually having anginal chest pain symptoms.  Most notable symptom was dyspnea.  She did look relatively comfortable on arrival just somewhat scared.   She has a harsh 4/6 SEM and at least 1-2/6 DM heard at the RUSB radiating to the carotids.  I suspect that she probably has worsening aortic stenosis and her presentation is more consistent with acute diastolic heart failure.   Bedside echocardiogram was very difficult to visualize the valve, but she did not appear to be having any regional wall motion normalities consistent with inferior lateral STEMI.  We therefore chose to cancel STEMI but will continue to follow with recommendations to assess the thoracic aorta with a CT angiogram of the chest abdomen pelvis to look for any potential aortic dissection.  She will need an echocardiogram rule out MI with troponins.  She will need blood pressure and heart rate control.   Full consult note to follow.   After discussion with the EDP and on-call fellow, we decided to cancel code STEMI.         Alm MICAEL Clay, MD, MS Alm Clay, M.D., M.S. Interventional Cardiologist  [facility] Pager # [phone]

## 2024-04-26 NOTE — ED Notes (Signed)
 Patient transported to CT scan .

## 2024-04-26 NOTE — ED Provider Notes (Signed)
 I provided a substantive portion of the care of this patient.  I personally made/approved the management plan for this patient and take responsibility for the patient management.     Patient arrived as code STEMI.  Patient has been extreme chest pain epigastric pain shortness of breath.  Reportedly patient is not compliant with antihypertensive medications.  Patient is alert.  No respiratory distress at rest.  Agree with plan of management.   Armenta Canning, MD 04/26/24 [phone]

## 2024-04-26 NOTE — ED Triage Notes (Signed)
 Patient arrived with EMS from home as a Code STEMI , reports SOB and upper abdominal pain this week , seen by cardiologist at arrival , received ASA 324 mg po by EMS prior to arrival , hypertensive , has not taken her antihypertensive medications for several days .

## 2024-04-27 ENCOUNTER — Telehealth (HOSPITAL_COMMUNITY): Payer: Self-pay | Admitting: Pharmacy Technician

## 2024-04-27 ENCOUNTER — Observation Stay (HOSPITAL_COMMUNITY)

## 2024-04-27 ENCOUNTER — Other Ambulatory Visit (HOSPITAL_COMMUNITY): Payer: Self-pay

## 2024-04-27 ENCOUNTER — Inpatient Hospital Stay (HOSPITAL_COMMUNITY): Admission: EM | Disposition: A | Payer: Self-pay | Source: Home / Self Care | Attending: Internal Medicine

## 2024-04-27 DIAGNOSIS — R9431 Abnormal electrocardiogram [ECG] [EKG]: Secondary | ICD-10-CM | POA: Insufficient documentation

## 2024-04-27 DIAGNOSIS — I10 Essential (primary) hypertension: Secondary | ICD-10-CM | POA: Diagnosis not present

## 2024-04-27 DIAGNOSIS — N1831 Chronic kidney disease, stage 3a: Secondary | ICD-10-CM

## 2024-04-27 DIAGNOSIS — I350 Nonrheumatic aortic (valve) stenosis: Secondary | ICD-10-CM | POA: Diagnosis not present

## 2024-04-27 DIAGNOSIS — I5031 Acute diastolic (congestive) heart failure: Secondary | ICD-10-CM | POA: Diagnosis not present

## 2024-04-27 DIAGNOSIS — R002 Palpitations: Secondary | ICD-10-CM | POA: Insufficient documentation

## 2024-04-27 DIAGNOSIS — I7121 Aneurysm of the ascending aorta, without rupture: Secondary | ICD-10-CM

## 2024-04-27 DIAGNOSIS — I5033 Acute on chronic diastolic (congestive) heart failure: Secondary | ICD-10-CM | POA: Insufficient documentation

## 2024-04-27 DIAGNOSIS — E876 Hypokalemia: Secondary | ICD-10-CM | POA: Insufficient documentation

## 2024-04-27 DIAGNOSIS — I712 Thoracic aortic aneurysm, without rupture, unspecified: Secondary | ICD-10-CM

## 2024-04-27 HISTORY — PX: RIGHT HEART CATH AND CORONARY ANGIOGRAPHY: CATH118264

## 2024-04-27 LAB — POCT I-STAT EG7
Acid-Base Excess: 1 mmol/L (ref 0.0–2.0)
Acid-Base Excess: 2 mmol/L (ref 0.0–2.0)
Bicarbonate: 25.4 mmol/L (ref 20.0–28.0)
Bicarbonate: 26.4 mmol/L (ref 20.0–28.0)
Calcium, Ion: 1.21 mmol/L (ref 1.15–1.40)
Calcium, Ion: 1.22 mmol/L (ref 1.15–1.40)
HCT: 45 % (ref 36.0–46.0)
HCT: 45 % (ref 36.0–46.0)
Hemoglobin: 15.3 g/dL — ABNORMAL HIGH (ref 12.0–15.0)
Hemoglobin: 15.3 g/dL — ABNORMAL HIGH (ref 12.0–15.0)
O2 Saturation: 66 %
O2 Saturation: 67 %
Potassium: 3.4 mmol/L — ABNORMAL LOW (ref 3.5–5.1)
Potassium: 3.4 mmol/L — ABNORMAL LOW (ref 3.5–5.1)
Sodium: 139 mmol/L (ref 135–145)
Sodium: 140 mmol/L (ref 135–145)
TCO2: 27 mmol/L (ref 22–32)
TCO2: 28 mmol/L (ref 22–32)
pCO2, Ven: 38.8 mmHg — ABNORMAL LOW (ref 44–60)
pCO2, Ven: 39.9 mmHg — ABNORMAL LOW (ref 44–60)
pH, Ven: 7.423 (ref 7.25–7.43)
pH, Ven: 7.429 (ref 7.25–7.43)
pO2, Ven: 33 mmHg (ref 32–45)
pO2, Ven: 34 mmHg (ref 32–45)

## 2024-04-27 LAB — MAGNESIUM
Magnesium: 1.9 mg/dL (ref 1.7–2.4)
Magnesium: 2.2 mg/dL (ref 1.7–2.4)

## 2024-04-27 LAB — ECHOCARDIOGRAM COMPLETE
AR max vel: 0.95 cm2
AV Area VTI: 1.15 cm2
AV Area mean vel: 1.09 cm2
AV Mean grad: 30 mmHg
AV Peak grad: 68.9 mmHg
Ao pk vel: 4.15 m/s
Area-P 1/2: 6.71 cm2
Height: 68 in
S' Lateral: 4.6 cm
Weight: 3324.8 [oz_av]

## 2024-04-27 LAB — POCT I-STAT 7, (LYTES, BLD GAS, ICA,H+H)
Acid-Base Excess: 1 mmol/L (ref 0.0–2.0)
Bicarbonate: 24.3 mmol/L (ref 20.0–28.0)
Calcium, Ion: 1.21 mmol/L (ref 1.15–1.40)
HCT: 45 % (ref 36.0–46.0)
Hemoglobin: 15.3 g/dL — ABNORMAL HIGH (ref 12.0–15.0)
O2 Saturation: 93 %
Potassium: 3.5 mmol/L (ref 3.5–5.1)
Sodium: 140 mmol/L (ref 135–145)
TCO2: 25 mmol/L (ref 22–32)
pCO2 arterial: 35 mmHg (ref 32–48)
pH, Arterial: 7.45 (ref 7.35–7.45)
pO2, Arterial: 62 mmHg — ABNORMAL LOW (ref 83–108)

## 2024-04-27 LAB — CBC
HCT: 46.4 % — ABNORMAL HIGH (ref 36.0–46.0)
Hemoglobin: 16 g/dL — ABNORMAL HIGH (ref 12.0–15.0)
MCH: 32.2 pg (ref 26.0–34.0)
MCHC: 34.5 g/dL (ref 30.0–36.0)
MCV: 93.4 fL (ref 80.0–100.0)
Platelets: 231 K/uL (ref 150–400)
RBC: 4.97 MIL/uL (ref 3.87–5.11)
RDW: 12.3 % (ref 11.5–15.5)
WBC: 12.2 K/uL — ABNORMAL HIGH (ref 4.0–10.5)
nRBC: 0 % (ref 0.0–0.2)

## 2024-04-27 LAB — HEMOGLOBIN A1C
Hgb A1c MFr Bld: 5.4 % (ref 4.8–5.6)
Mean Plasma Glucose: 108 mg/dL

## 2024-04-27 LAB — BASIC METABOLIC PANEL WITH GFR
Anion gap: 12 (ref 5–15)
BUN: 12 mg/dL (ref 8–23)
CO2: 22 mmol/L (ref 22–32)
Calcium: 8.9 mg/dL (ref 8.9–10.3)
Chloride: 102 mmol/L (ref 98–111)
Creatinine, Ser: 0.93 mg/dL (ref 0.44–1.00)
GFR, Estimated: >60 mL/min (ref >60)
Glucose, Bld: 145 mg/dL — ABNORMAL HIGH (ref 70–99)
Potassium: 3.4 mmol/L — ABNORMAL LOW (ref 3.5–5.1)
Sodium: 136 mmol/L (ref 135–145)

## 2024-04-27 LAB — HIV ANTIBODY (ROUTINE TESTING W REFLEX): HIV Screen 4th Generation wRfx: NONREACTIVE

## 2024-04-27 LAB — CREATININE, SERUM
Creatinine, Ser: 0.86 mg/dL (ref 0.44–1.00)
GFR, Estimated: >60 mL/min (ref >60)

## 2024-04-27 LAB — TROPONIN I (HIGH SENSITIVITY): Troponin I (High Sensitivity): 124 ng/L (ref <18)

## 2024-04-27 SURGERY — RIGHT HEART CATH AND CORONARY ANGIOGRAPHY
Anesthesia: LOCAL

## 2024-04-27 MED ORDER — LABETALOL HCL 5 MG/ML IV SOLN: 10.0000 mg | INTRAVENOUS | Status: AC | PRN

## 2024-04-27 MED ORDER — POTASSIUM CHLORIDE 20 MEQ PO PACK
40.0000 meq | PACK | Freq: Once | ORAL | Status: AC
  Administered 2024-04-27: 40 meq via ORAL
  Filled 2024-04-27: qty 2

## 2024-04-27 MED ORDER — EMPAGLIFLOZIN 10 MG PO TABS
10.0000 mg | ORAL_TABLET | Freq: Every day | ORAL | Status: DC
  Administered 2024-04-27 – 2024-04-28 (×2): 10 mg via ORAL
  Filled 2024-04-27 (×2): qty 1

## 2024-04-27 MED ORDER — PERFLUTREN LIPID MICROSPHERE
1.0000 mL | INTRAVENOUS | Status: AC | PRN
  Administered 2024-04-27: 3 mL via INTRAVENOUS

## 2024-04-27 MED ORDER — LIDOCAINE HCL (PF) 1 % IJ SOLN
INTRAMUSCULAR | Status: DC | PRN
  Administered 2024-04-27 (×2): 5 mL via INTRADERMAL

## 2024-04-27 MED ORDER — SODIUM CHLORIDE 0.9 % IV SOLN: 250.0000 mL | INTRAVENOUS | Status: DC | PRN

## 2024-04-27 MED ORDER — FENTANYL CITRATE (PF) 100 MCG/2ML IJ SOLN
INTRAMUSCULAR | Status: AC
  Filled 2024-04-27: qty 2

## 2024-04-27 MED ORDER — FREE WATER
250.0000 mL | Freq: Once | Status: AC
  Administered 2024-04-27: 250 mL via ORAL

## 2024-04-27 MED ORDER — IOHEXOL 350 MG/ML SOLN
INTRAVENOUS | Status: DC | PRN
  Administered 2024-04-27: 70 mL

## 2024-04-27 MED ORDER — HEPARIN SODIUM (PORCINE) 1000 UNIT/ML IJ SOLN
INTRAMUSCULAR | Status: AC
  Filled 2024-04-27: qty 10

## 2024-04-27 MED ORDER — HYDRALAZINE HCL 20 MG/ML IJ SOLN: 10.0000 mg | INTRAMUSCULAR | Status: AC | PRN

## 2024-04-27 MED ORDER — ACETAMINOPHEN 325 MG PO TABS: 650.0000 mg | ORAL_TABLET | ORAL | Status: DC | PRN

## 2024-04-27 MED ORDER — SODIUM CHLORIDE 0.9% FLUSH: 3.0000 mL | INTRAVENOUS | Status: DC | PRN

## 2024-04-27 MED ORDER — HEPARIN (PORCINE) IN NACL 1000-0.9 UT/500ML-% IV SOLN
INTRAVENOUS | Status: DC | PRN
  Administered 2024-04-27 (×2): 500 mL

## 2024-04-27 MED ORDER — FUROSEMIDE 10 MG/ML IJ SOLN
40.0000 mg | Freq: Every day | INTRAMUSCULAR | Status: DC
  Administered 2024-04-27: 40 mg via INTRAVENOUS
  Filled 2024-04-27: qty 4

## 2024-04-27 MED ORDER — ORAL CARE MOUTH RINSE: 15.0000 mL | OROMUCOSAL | Status: DC | PRN

## 2024-04-27 MED ORDER — LIDOCAINE HCL (PF) 1 % IJ SOLN
INTRAMUSCULAR | Status: AC
  Filled 2024-04-27: qty 30

## 2024-04-27 MED ORDER — MIDAZOLAM HCL (PF) 2 MG/2ML IJ SOLN
INTRAMUSCULAR | Status: DC | PRN
  Administered 2024-04-27 (×2): 1 mg via INTRAVENOUS

## 2024-04-27 MED ORDER — FREE WATER
500.0000 mL | Freq: Once | Status: AC
  Administered 2024-04-27: 500 mL via ORAL

## 2024-04-27 MED ORDER — IOHEXOL 350 MG/ML SOLN
100.0000 mL | Freq: Once | INTRAVENOUS | Status: AC | PRN
  Administered 2024-04-27: 100 mL via INTRAVENOUS

## 2024-04-27 MED ORDER — CARVEDILOL 3.125 MG PO TABS
3.1250 mg | ORAL_TABLET | Freq: Two times a day (BID) | ORAL | Status: DC
  Administered 2024-04-27 – 2024-04-28 (×3): 3.125 mg via ORAL
  Filled 2024-04-27 (×3): qty 1

## 2024-04-27 MED ORDER — FUROSEMIDE 10 MG/ML IJ SOLN
40.0000 mg | INTRAMUSCULAR | Status: AC
  Administered 2024-04-27: 40 mg via INTRAVENOUS
  Filled 2024-04-27: qty 4

## 2024-04-27 MED ORDER — POTASSIUM CHLORIDE CRYS ER 20 MEQ PO TBCR
40.0000 meq | EXTENDED_RELEASE_TABLET | ORAL | Status: AC
  Administered 2024-04-27 (×2): 40 meq via ORAL
  Filled 2024-04-27 (×2): qty 2

## 2024-04-27 MED ORDER — SODIUM CHLORIDE 0.9% FLUSH
3.0000 mL | Freq: Two times a day (BID) | INTRAVENOUS | Status: DC
  Administered 2024-04-27 – 2024-04-28 (×3): 3 mL via INTRAVENOUS

## 2024-04-27 MED ORDER — SODIUM CHLORIDE 0.9% FLUSH
3.0000 mL | Freq: Two times a day (BID) | INTRAVENOUS | Status: DC
  Administered 2024-04-27 – 2024-04-28 (×4): 3 mL via INTRAVENOUS

## 2024-04-27 MED ORDER — SODIUM CHLORIDE 0.9 % IV SOLN: 250.0000 mL | INTRAVENOUS | Status: AC | PRN

## 2024-04-27 MED ORDER — ENOXAPARIN SODIUM 40 MG/0.4ML IJ SOSY
40.0000 mg | PREFILLED_SYRINGE | Freq: Every day | INTRAMUSCULAR | Status: DC
  Administered 2024-04-27: 40 mg via SUBCUTANEOUS
  Filled 2024-04-27 (×2): qty 0.4

## 2024-04-27 MED ORDER — VERAPAMIL HCL 2.5 MG/ML IV SOLN
INTRAVENOUS | Status: AC
  Filled 2024-04-27: qty 2

## 2024-04-27 MED ORDER — LISINOPRIL 10 MG PO TABS
10.0000 mg | ORAL_TABLET | Freq: Every day | ORAL | Status: DC
  Administered 2024-04-27 – 2024-04-28 (×2): 10 mg via ORAL
  Filled 2024-04-27 (×2): qty 1

## 2024-04-27 MED ORDER — ADULT MULTIVITAMIN W/MINERALS CH
1.0000 | ORAL_TABLET | Freq: Every morning | ORAL | Status: DC
  Administered 2024-04-27 – 2024-04-28 (×2): 1 via ORAL
  Filled 2024-04-27 (×2): qty 1

## 2024-04-27 MED ORDER — MIDAZOLAM HCL 2 MG/2ML IJ SOLN
INTRAMUSCULAR | Status: AC
  Filled 2024-04-27: qty 2

## 2024-04-27 MED ORDER — HEPARIN SODIUM (PORCINE) 1000 UNIT/ML IJ SOLN
INTRAMUSCULAR | Status: DC | PRN
  Administered 2024-04-27: 4500 [IU] via INTRAVENOUS

## 2024-04-27 MED ORDER — ASPIRIN 81 MG PO CHEW: 81.0000 mg | CHEWABLE_TABLET | ORAL | Status: DC

## 2024-04-27 MED ORDER — MAGNESIUM SULFATE IN D5W 1-5 GM/100ML-% IV SOLN
1.0000 g | Freq: Once | INTRAVENOUS | Status: AC
  Administered 2024-04-27: 1 g via INTRAVENOUS
  Filled 2024-04-27: qty 100

## 2024-04-27 MED ORDER — CALCIUM GLUCONATE-NACL 1-0.675 GM/50ML-% IV SOLN
1.0000 g | Freq: Once | INTRAVENOUS | Status: AC
  Administered 2024-04-27: 1000 mg via INTRAVENOUS
  Filled 2024-04-27: qty 50

## 2024-04-27 MED ORDER — FENTANYL CITRATE (PF) 100 MCG/2ML IJ SOLN
INTRAMUSCULAR | Status: DC | PRN
  Administered 2024-04-27 (×2): 25 ug via INTRAVENOUS

## 2024-04-27 SURGICAL SUPPLY — 12 items
CATH BALLN WEDGE 5F 110CM (CATHETERS) IMPLANT
CATH INFINITI 5 FR JL3.5 (CATHETERS) IMPLANT
CATH INFINITI AMBI 5FR TG (CATHETERS) IMPLANT
CATH INFINITI JR4 5F (CATHETERS) IMPLANT
DEVICE RAD COMP TR BAND LRG (VASCULAR PRODUCTS) IMPLANT
GLIDESHEATH SLEND SS 6F .021 (SHEATH) IMPLANT
GUIDEWIRE INQWIRE 1.5J.035X260 (WIRE) IMPLANT
PACK CARDIAC CATHETERIZATION (CUSTOM PROCEDURE TRAY) ×2 IMPLANT
SET ATX-X65L (MISCELLANEOUS) IMPLANT
SHEATH GLIDE SLENDER 4/5FR (SHEATH) IMPLANT
SHEATH PROBE COVER 6X72 (BAG) IMPLANT
WIRE EMERALD 3MM-J .025X260CM (WIRE) IMPLANT

## 2024-04-27 NOTE — Plan of Care (Signed)

## 2024-04-27 NOTE — Progress Notes (Signed)
  Echocardiogram 2D Echocardiogram has been performed.  Jane [facility]ley, RDCS 04/27/2024, 8:42 AM

## 2024-04-27 NOTE — H&P (View-Only) (Signed)
  Progress Note  Patient Name: Jane Conley Date of Encounter: 04/27/2024 [facility] Cardiologist: Anner  Interval Summary   No acute events overnight.  Patient denies chest pain, dyspnea, presyncope, syncope.  Overall feels well  Vital Signs Vitals:   04/27/24 0222 04/27/24 0224 04/27/24 0301 04/27/24 0520  BP:  125/83  (!) 107/57  Pulse:  89 79 64  Resp:  18  18  Temp: 98 F (36.7 C)   97.9 F (36.6 C)  TempSrc: Oral   Oral  SpO2:   92% 96%  Weight:      Height:        Intake/Output Summary (Last 24 hours) at 04/27/2024 0715 Last data filed at 04/27/2024 0545 Gross per 24 hour  Intake --  Output 500 ml  Net -500 ml      04/27/2024    2:17 AM 09/19/2015    8:56 AM 05/26/2014    8:41 AM  Last 3 Weights  Weight (lbs) 207 lb 12.8 oz 193 lb 209 lb  Weight (kg) 94.257 kg 87.544 kg 94.802 kg      Telemetry/ECG  NSR with IVCD - Personally Reviewed  Physical Exam  GEN: No acute distress.   Neck: No JVD Cardiac: RRR, 3/6 SEM, rubs, or gallops.  Respiratory: Clear to auscultation bilaterally. GI: Soft, nontender, non-distended  MS: No edema  Assessment & Plan  Hypertensive urgency/emergency:   BP stable now, continue lisinopril 10mg . Acute on chronic diastolic HF Euvolemic now Continue lisinopril 10mg , lasix 40mg , start Jardiance 10mg  Aortic stenosis TTE 2017 AVA 1.2, MG 23, EF 50-55% Patient has been dyspneic for several months.  TTE 20217 with moderate AS.  Patient has a significant systolic murmur.  I suspect patient will need aortic valve intervention.  Will follow up TTE today and plan for coronary angiography and CTA prior to discharge.  She does not seem to have many comorbid conditions so she would seem to be a candidate for TAVR or SAVR--CTA will help inform what should be pursued.  Seems like she could be discharged home prior to intervention.   Continue lasix 40mg  Thoracic aneurysm CT PE shows it only to be 4.4cm CKD IIIa Continue  lisinopril Start Jardiance 10mg    ADDENDUM 12/1 11:51AM Reviewed echo consistent with severe aortic valvular disease (severe AS + moderate AR) with LV dysfunction.  Will refer for cor angio + RHC today.   Discussed with patient ahd family.  I have reviewed the risks, indications, and alternatives to cardiac catheterization, possible angioplasty, and stenting with the patient. Risks include but are not limited to bleeding, infection, vascular injury, stroke, myocardial infection, arrhythmia, kidney injury, radiation-related injury in the case of prolonged fluoroscopy use, emergency cardiac surgery, and death. The patient understands the risks of serious complication is 1-2 in 1000 with diagnostic cardiac cath and 1-2% or less with angioplasty/stenting.      For questions or updates, please contact [facility] Please consult [email or web address] for contact info under         Signed, Albaraa Swingle K Gurshan Settlemire, MD

## 2024-04-27 NOTE — Interval H&P Note (Signed)
 History and Physical Interval Note:  04/27/2024 1:20 PM  Jane Conley  has presented today for surgery, with the diagnosis of SEVERE AS.  The various methods of treatment have been discussed with the patient and family. After consideration of risks, benefits and other options for treatment, the patient has consented to  Procedure(s): RIGHT/LEFT HEART CATH AND CORONARY ANGIOGRAPHY (N/A)  PERCUTANEOUS CORONARY INTERVENTION  as a surgical intervention.  The patient's history has been reviewed, patient examined, no change in status, stable for surgery.  I have reviewed the patient's chart and labs.  Questions were answered to the patient's satisfaction.    Cath Lab Visit (complete for each Cath Lab visit)  Clinical Evaluation Leading to the Procedure:   ACS: No.  Non-ACS:    Anginal Classification: CCS II  Anti-ischemic medical therapy: Minimal Therapy (1 class of medications)  Non-Invasive Test Results: No non-invasive testing performed  Prior CABG: No previous CABG     Alm Clay

## 2024-04-27 NOTE — Telephone Encounter (Signed)
 Patient Product/process development scientist completed.    The patient is insured through [employer]/ [employer]. Patient has Medicare and is not eligible for a copay card, but may be able to apply for patient assistance or Medicare RX Payment Plan (Patient Must reach out to their plan, if eligible for payment plan), if available.    Ran test claim for Jardiance 10 mg and the current 30 day co-pay is $47.00.   This test claim was processed through [facility]- copay amounts may vary at other pharmacies due to pharmacy/plan contracts, or as the patient moves through the different stages of their insurance plan.     Reyes Sharps, CPHT Pharmacy Technician Patient Advocate Specialist Lead [facility] Patient Advocate Team Direct Number: [phone]  Fax: [phone]

## 2024-04-27 NOTE — Progress Notes (Signed)
 Consultation Progress Note   Patient: Jane Conley FMW:[id number] DOB: 30-Sep-1955 DOA: 04/26/2024 DOS: the patient was seen and examined on 04/27/2024 Primary service: DibiaLandon BRAVO, MD  Brief hospital course: 68 y.o. female with history h/o hypertension, thoracic aortic dilatation 4.5 cm, moderate LVH with EF 50 to 55% on echo from 2017, moderate aortic stenosis presented via EMS with complaints of worsening shortness of breath with orthopnea over the last few months and worsening over the last couple of days.  Reports associated ankle edema, palpitation, heartbeat and pauses noted on her Apple Watch.  On EMS evaluation there was concern for significantly elevated BP, concern for ST-T changes on EKG , brought in as code STEMI and stat cardiology evaluation was obtained.  No complaints of chest pain on initial EMS evaluation and upon cardiology evaluation here.  She was saturating well on room air.  Patient afebrile, blood pressure elevated at 186/91, respiratory rate 20-27, pulse 80-90s   HS troponin elevated at 36, BNP 813, chest x-ray showed no evidence of pneumonia or pulmonary congestion but reported prominent right heart border suspicious for ascending aortic aneurysm (patient with known TAA).  Patient evaluated by cardiology who felt her presentation was consistent with acute CHF exacerbation likely in the setting of aortic stenosis.  STEMI ruled out.  Bedside echo showed no wall motion abnormalities.  Elevated troponin felt to be demand ischemia and patient received IV Lasix, IV hydralazine 1 dose in the ED.  Patient's blood pressure has improved and requested to be admitted for further evaluation and management.    Assessment and Plan: 1.  Acute diastolic CHF exacerbation in the setting of valvular heart disease: Shortness of breath improved, on room air  Continue Lasix 40 mg IV daily Continue lisinopril 10 mg, Jardiance 10 mg. Echo- EF is now 40-45%, severe LVH, severe LV dilation,  moderate to severe aortic stenosis with mild dilation of ascending aorta. Grade 1 diastolic dysfunction noted  Cardiology plans for  right and left heart cath today   2.  Hypertension urgency/emergency:  Stable blood pressure. On lisinopril 10 mg.    3. Thoracic aortic aneurysm:  - Severe aortic stenosis Echo consistent with severe aortic stenosis and moderate aortic regurgitation with LV dysfunction. Plan for coronary angio and right heart cath today per cardiology.   4. Prolonged QTc interval, hypokalemia, hypocalcemia: Replace potassium especially with ongoing diuresis. Replace Ca (ionized ca 0.9).  Will give IV magnesium   Goal to keep potassium >4, magnesium >2         TRH will continue to follow the patient.  Subjective: Seen at bedside this morning, reports shortness of breath has improved.  Physical Exam: Vitals:   04/27/24 0224 04/27/24 0301 04/27/24 0520 04/27/24 0758  BP: 125/83  (!) 107/57 139/80  Pulse: 89 79 64 81  Resp: 18  18 20   Temp:   97.9 F (36.6 C) 98.5 F (36.9 C)  TempSrc:   Oral Oral  SpO2:  92% 96% 94%  Weight:      Height:       General  No acute Distress Eyes: PERRL, lids and conjunctivae normal ENMT: Mucous membranes are moist.   Neck: normal, supple, no masses, no thyromegaly Respiratory: clear to auscultation bilaterally, no wheezing, no crackles. Normal respiratory effort. No accessory muscle use.  Cardiovascular: Regular rate and rhythm, +SEM / rubs / gallops Abdomen: Soft, nontender nondistended. Musculoskeletal: no clubbing / cyanosis. No joint deformity upper and lower extremities.  Skin: no rashes, lesions,  ulcers. No induration Neurologic: Facial asymmetry, moving extremity spontaneously, speech fluent. Psychiatric: Normal judgment and insight. Alert and oriented x 3. Normal mood.   Data Reviewed:    CBC    Component Value Date/Time   WBC 12.2 (H) 04/27/2024 0130   RBC 4.97 04/27/2024 0130   HGB 16.0 (H) 04/27/2024 0130    HCT 46.4 (H) 04/27/2024 0130   PLT 231 04/27/2024 0130   MCV 93.4 04/27/2024 0130   MCH 32.2 04/27/2024 0130   MCHC 34.5 04/27/2024 0130   RDW 12.3 04/27/2024 0130   LYMPHSABS 2.4 02/08/2014 1845   MONOABS 0.7 02/08/2014 1845   EOSABS 0.2 02/08/2014 1845   BASOSABS 0.1 02/08/2014 1845    CMP     Component Value Date/Time   NA 136 04/27/2024 0947   K 3.4 (L) 04/27/2024 0947   CL 102 04/27/2024 0947   CO2 22 04/27/2024 0947   GLUCOSE 145 (H) 04/27/2024 0947   BUN 12 04/27/2024 0947   CREATININE 0.93 04/27/2024 0947   CREATININE 1.15 (H) 06/17/2014 0917   CALCIUM 8.9 04/27/2024 0947   PROT 7.1 04/26/2024 2214   ALBUMIN 3.8 04/26/2024 2214   AST 28 04/26/2024 2214   ALT 24 04/26/2024 2214   ALKPHOS 68 04/26/2024 2214   BILITOT 1.1 04/26/2024 2214   GFRNONAA >60 04/27/2024 0947    Family Communication: Family updated at bedside  Time spent: 35 minutes.  Author: Landon FORBES Baller, MD 04/27/2024 11:46 AM  For on call review [email or web address].

## 2024-04-27 NOTE — Progress Notes (Addendum)
  Progress Note  Patient Name: Jane Conley Date of Encounter: 04/27/2024 [facility] Cardiologist: Anner  Interval Summary   No acute events overnight.  Patient denies chest pain, dyspnea, presyncope, syncope.  Overall feels well  Vital Signs Vitals:   04/27/24 0222 04/27/24 0224 04/27/24 0301 04/27/24 0520  BP:  125/83  (!) 107/57  Pulse:  89 79 64  Resp:  18  18  Temp: 98 F (36.7 C)   97.9 F (36.6 C)  TempSrc: Oral   Oral  SpO2:   92% 96%  Weight:      Height:        Intake/Output Summary (Last 24 hours) at 04/27/2024 0715 Last data filed at 04/27/2024 0545 Gross per 24 hour  Intake --  Output 500 ml  Net -500 ml      04/27/2024    2:17 AM 09/19/2015    8:56 AM 05/26/2014    8:41 AM  Last 3 Weights  Weight (lbs) 207 lb 12.8 oz 193 lb 209 lb  Weight (kg) 94.257 kg 87.544 kg 94.802 kg      Telemetry/ECG  NSR with IVCD - Personally Reviewed  Physical Exam  GEN: No acute distress.   Neck: No JVD Cardiac: RRR, 3/6 SEM, rubs, or gallops.  Respiratory: Clear to auscultation bilaterally. GI: Soft, nontender, non-distended  MS: No edema  Assessment & Plan  Hypertensive urgency/emergency:   BP stable now, continue lisinopril 10mg . Acute on chronic diastolic HF Euvolemic now Continue lisinopril 10mg , lasix 40mg , start Jardiance 10mg  Aortic stenosis TTE 2017 AVA 1.2, MG 23, EF 50-55% Patient has been dyspneic for several months.  TTE 20217 with moderate AS.  Patient has a significant systolic murmur.  I suspect patient will need aortic valve intervention.  Will follow up TTE today and plan for coronary angiography and CTA prior to discharge.  She does not seem to have many comorbid conditions so she would seem to be a candidate for TAVR or SAVR--CTA will help inform what should be pursued.  Seems like she could be discharged home prior to intervention.   Continue lasix 40mg  Thoracic aneurysm CT PE shows it only to be 4.4cm CKD IIIa Continue  lisinopril Start Jardiance 10mg    ADDENDUM 12/1 11:51AM Reviewed echo consistent with severe aortic valvular disease (severe AS + moderate AR) with LV dysfunction.  Will refer for cor angio + RHC today.   Discussed with patient ahd family.  I have reviewed the risks, indications, and alternatives to cardiac catheterization, possible angioplasty, and stenting with the patient. Risks include but are not limited to bleeding, infection, vascular injury, stroke, myocardial infection, arrhythmia, kidney injury, radiation-related injury in the case of prolonged fluoroscopy use, emergency cardiac surgery, and death. The patient understands the risks of serious complication is 1-2 in 1000 with diagnostic cardiac cath and 1-2% or less with angioplasty/stenting.      For questions or updates, please contact [facility] Please consult [email or web address] for contact info under         Signed, Albaraa Swingle K Gurshan Settlemire, MD

## 2024-04-27 NOTE — H&P (Signed)
 History and Physical    DOA: 04/26/2024  PCP: Shona Norleen PEDLAR, MD  Patient coming from: home  Chief Complaint: SOB  HPI: Jane Conley is a 68 y.o. female with history h/o hypertension, thoracic aortic dilatation 4.5 cm, moderate LVH with EF 50 to 55% on echo from 2017, moderate aortic stenosis presented via EMS with complaints of worsening shortness of breath with orthopnea over the last few months and worsening over the last couple of days.  She occasionally has ankle edema but no recent worsening.  Patient states she stopped taking Hyzaar (losartan/HCTZ) in July as her blood pressure remained in systolic 140s with or without the medication.  She reports taking her son-in-law's blood pressure medication (unknown name) day before yesterday as her blood pressure was elevated at systolic 180s.  Patient also reports experiencing palpitations and noting irregular heart beat with pauses-she states she has an Apple watch that would show heart rate up to 120s during these episodes.  She is aware of aortic stenosis diagnosis and denies frequent dizzy spells although recollects an episode few weeks back when she went to visit her granddaughter at the college dorm and had to take the stairs which caused her to have shortness of breath along with dizziness.  On EMS evaluation there was concern for significantly elevated BP, concern for ST-T changes on EKG , brought in as code STEMI and stat cardiology evaluation was obtained.  No complaints of chest pain on initial EMS evaluation and upon cardiology evaluation here.  She was saturating well on room air.  Patient afebrile, blood pressure elevated at 186/91, respiratory rate 20-27, pulse 80-90s.  Labs with unremarkable CBC other than hemoglobin 15.7.  BMP showed sodium 137, potassium 3.4, Ca 8.8, bicarb 19, BUN 14, creatinine 1.03. HS troponin elevated at 36, BNP 813, chest x-ray showed no evidence of pneumonia or pulmonary congestion but reported prominent right  heart border suspicious for ascending aortic aneurysm (patient with known TAA).  Patient evaluated by cardiology who felt her presentation was consistent with acute CHF exacerbation likely in the setting of aortic stenosis.  STEMI ruled out.  Bedside echo showed no wall motion abnormalities.  Elevated troponin felt to be demand ischemia and patient received IV Lasix, IV hydralazine 1 dose in the ED.  Patient's blood pressure has improved and requested to be admitted for further evaluation and management.   Review of Systems: As per HPI, otherwise review of systems negative.    No past medical history on file.  Past Surgical History:  Procedure Laterality Date   ABDOMINAL HYSTERECTOMY     leaking aortic valve      Social history:  reports that she has never smoked. She does not have any smokeless tobacco history on file. She reports that she does not drink alcohol and does not use drugs.   Allergies  Allergen Reactions   Codeine Nausea And Vomiting and Swelling    No family history on file.    Prior to Admission medications   Medication Sig Start Date End Date Taking? Authorizing Provider  benzonatate  (TESSALON ) 100 MG capsule Take 1 capsule (100 mg total) by mouth 3 (three) times daily as needed. 06/21/23   Kennyth Domino, FNP  losartan-hydrochlorothiazide (HYZAAR) 50-12.5 MG tablet  09/01/15   [provider]  Multiple Vitamin (MULTIVITAMIN WITH MINERALS) TABS tablet Take 1 tablet by mouth every morning.    [provider]    Physical Exam: Vitals:   04/27/24 0055 04/27/24 0100 04/27/24 0130 04/27/24  0137  BP: 128/72 113/65 (!) 143/73   Pulse:  91 92   Resp:  (!) 23 (!) 23   Temp:    (!) 97.4 F (36.3 C)  TempSrc:    Temporal  SpO2:  97% 96%     Constitutional: NAD, calm, comfortable Eyes: PERRL, lids and conjunctivae normal ENMT: Mucous membranes are moist. Posterior pharynx clear of any exudate or lesions.Normal dentition.  Neck: normal, supple, no  masses, no thyromegaly Respiratory: clear to auscultation bilaterally, no wheezing, no crackles. Normal respiratory effort. No accessory muscle use.  Cardiovascular: Regular rate and rhythm, +systolic murmur at right sternal border, no rubs / gallops. No extremity edema. 2+ pedal pulses. No carotid bruits.  Abdomen: no tenderness, no masses palpated. No hepatosplenomegaly. Bowel sounds positive.  Musculoskeletal: no clubbing / cyanosis. No joint deformity upper and lower extremities. Good ROM, no contractures. Normal muscle tone.  Neurologic: CN 2-12 grossly intact. Sensation intact, DTR normal. Strength 5/5 in all 4.  Psychiatric: Normal judgment and insight. Alert and oriented x 3. Normal mood.  SKIN/catheters: no rashes, lesions, ulcers. No induration  Labs on Admission: I have personally reviewed following labs and imaging studies  CBC: Recent Labs  Lab 04/26/24 2214 04/26/24 2224 04/27/24 0130  WBC 10.5  --  12.2*  HGB 15.7* 15.6* 16.0*  HCT 47.2* 46.0 46.4*  MCV 96.7  --  93.4  PLT 232  --  231   Basic Metabolic Panel: Recent Labs  Lab 04/26/24 2214 04/26/24 2224  NA 137 138  K 3.4* 3.4*  CL 106  --   CO2 19*  --   GLUCOSE 115*  --   BUN 14  --   CREATININE 1.03*  --   CALCIUM 8.8*  --    GFR: CrCl cannot be calculated (Unknown ideal weight.). Recent Labs  Lab 04/26/24 2214 04/27/24 0130  WBC 10.5 12.2*   Liver Function Tests: Recent Labs  Lab 04/26/24 2214  AST 28  ALT 24  ALKPHOS 68  BILITOT 1.1  PROT 7.1  ALBUMIN 3.8   Recent Labs  Lab 04/26/24 2214  LIPASE 28   No results for input(s): AMMONIA in the last 168 hours. Coagulation Profile: Recent Labs  Lab 04/26/24 2214  INR 1.0   Cardiac Enzymes: No results for input(s): CKTOTAL, CKMB, CKMBINDEX, TROPONINI in the last 168 hours. BNP (last 3 results) No results for input(s): PROBNP in the last 8760 hours. HbA1C: No results for input(s): HGBA1C in the last 72  hours. CBG: No results for input(s): GLUCAP in the last 168 hours. Lipid Profile: No results for input(s): CHOL, HDL, LDLCALC, TRIG, CHOLHDL, LDLDIRECT in the last 72 hours. Thyroid Function Tests: No results for input(s): TSH, T4TOTAL, FREET4, T3FREE, THYROIDAB in the last 72 hours. Anemia Panel: No results for input(s): VITAMINB12, FOLATE, FERRITIN, TIBC, IRON, RETICCTPCT in the last 72 hours. Urine analysis: No results found for: COLORURINE, APPEARANCEUR, LABSPEC, PHURINE, GLUCOSEU, HGBUR, BILIRUBINUR, KETONESUR, PROTEINUR, UROBILINOGEN, NITRITE, LEUKOCYTESUR  Radiological Exams on Admission: Personally reviewed  CT Angio Chest/Abd/Pel for Dissection W and/or Wo Contrast Result Date: 04/27/2024 EXAM: CTA CHEST, ABDOMEN AND PELVIS WITHOUT AND WITH CONTRAST 04/27/2024 12:02:00 AM TECHNIQUE: CTA of the chest was performed without and with the administration of intravenous contrast. 100 mL (iohexol  (OMNIPAQUE ) 350 MG/ML injection 100 mL IOHEXOL  350 MG/ML SOLN) was administered. CTA of the abdomen and pelvis was performed with the administration of intravenous contrast. Multiplanar reformatted images are provided for review. MIP images are provided for review.  Automated exposure control, iterative reconstruction, and/or weight based adjustment of the mA/kV was utilized to reduce the radiation dose to as low as reasonably achievable. COMPARISON: Same day x-ray. CLINICAL HISTORY: Aortic aneurysm suspected. FINDINGS: VASCULATURE: AORTA: Ascending aortic aneurysm measuring 44 mm in maximum diameter. No dissection, intramural hematoma, or penetrating atherosclerotic ulcer. No abdominal aortic aneurysm. PULMONARY ARTERIES: No pulmonary embolism with the limits of this exam. GREAT VESSELS OF AORTIC ARCH: No acute finding. No dissection. No arterial occlusion or significant stenosis. CELIAC TRUNK: No acute finding. No occlusion or significant stenosis.  SUPERIOR MESENTERIC ARTERY: No acute finding. No occlusion or significant stenosis. INFERIOR MESENTERIC ARTERY: No acute finding. No occlusion or significant stenosis. RENAL ARTERIES: No acute finding. No occlusion or significant stenosis. ILIAC ARTERIES: No acute finding. No occlusion or significant stenosis. CHEST: MEDIASTINUM: Cardiomegaly. Aortic valve calcifications. No mediastinal lymphadenopathy. The pericardium demonstrates no acute abnormality. LUNGS AND PLEURA: Small bilateral pleural effusions. Patchy bilateral ground glass opacities and diffuse interlobular septal thickening. No pneumothorax. THORACIC BONES AND SOFT TISSUES: No acute bone or soft tissue abnormality. ABDOMEN AND PELVIS: LIVER: The liver is unremarkable. GALLBLADDER AND BILE DUCTS: Cholelithiasis. No evidence of acute cholecystitis. No biliary ductal dilatation. SPLEEN: The spleen is unremarkable. PANCREAS: The pancreas is unremarkable. ADRENAL GLANDS: Bilateral adrenal glands demonstrate no acute abnormality. KIDNEYS, URETERS AND BLADDER: No stones in the kidneys or ureters. No hydronephrosis. No perinephric or periureteral stranding. Urinary bladder is unremarkable. GI AND BOWEL: Stomach and duodenal sweep demonstrate no acute abnormality. Diverticulosis without diverticulitis. Normal appendix. There is no bowel obstruction. No abnormal bowel wall thickening or distension. REPRODUCTIVE: Reproductive organs are unremarkable. PERITONEUM AND RETROPERITONEUM: No ascites or free air. LYMPH NODES: No lymphadenopathy. ABDOMINAL BONES AND SOFT TISSUES: No acute abnormality of the bones. No acute soft tissue abnormality. IMPRESSION: 1. Findings compatible with CHF with pulmonary edema and small bilateral pleural effusions. 2. Ascending aortic aneurysm measuring 44 mm in maximum diameter. Recommend annual imaging followup by CTA or MRA. This recommendation follows 2010 ACCF/AHA/AATS/ACR/ASA/SCA/SCAI/SIR/STS/SVM Guidelines for the Diagnosis and  Management of Patients with Thoracic Aortic Disease. Circulation. 2010; 121: Z733-z630. Aortic aneurysm NOS (ICD10-I71.9) Electronically signed by: Norman Gatlin MD 04/27/2024 12:25 AM EST RP Workstation: [id number]   DG Chest 1 View Result Date: 04/26/2024 EXAM: 1 VIEW(S) XRAY OF THE CHEST 04/26/2024 10:58:00 PM COMPARISON: None available. CLINICAL HISTORY: Breath, shortness. FINDINGS: LUNGS AND PLEURA: No focal pulmonary opacity. No pleural effusion. No pneumothorax. HEART AND MEDIASTINUM: Cardiomegaly. Prominent right heart border suspicious for ascending aortic aneurysm. BONES AND SOFT TISSUES: No acute osseous abnormality. IMPRESSION: 1. Prominent right heart border suspicious for ascending aortic aneurysm. Further evaluation with CTA of the chest is recommended. Electronically signed by: Norman Gatlin MD 04/26/2024 11:10 PM EST RP Workstation: [id number]    EKG: Independently reviewed.  LVH pattern ST-T changes,     Assessment and Plan:   Principal Problem:   Acute diastolic CHF (congestive heart failure) (HCC) Active Problems:   Thoracic aortic aneurysm   HTN (hypertension)   Prolonged QT interval   Hypokalemia   Palpitations    1.  Acute diastolic CHF exacerbation in the setting of valvular heart disease: Patient received IV diuretics in the ED.  Dyspnea and blood pressures appear to be improving at this point.  Appreciate cardiology evaluation and per their note recommendations to assess the thoracic aorta with a CT angiogram of the chest abdomen pelvis to look for any potential aortic dissection. She will need an echocardiogram rule out MI with troponins.  She will need blood pressure and heart rate control.  CTA completed and per report findings compatible with CHF with pulmonary edema and small bilateral pleural effusions. Ascending aortic aneurysm measuring 44 mm in maximum diameter. Radiologist recommended annual imaging followup by CTA or MRA.  Continue IV diuresis for now,  cardiology to follow-up in a.m. and recommend further.  Last echo in 2017 showed LVH.  EKG currently with LVH pattern and prolonged QTc.  2D echo ordered and pending.  2.  Hypertension, uncontrolled: Could be in the setting of volume overload and problem #1.  Appears to be improving with IV Lasix and IV hydralazine that patient received in the ED.  Last BP with systolic 120s-140s, will monitor with continued IV diuresis, low dose ACEI, beta-blockers given complaints of palpitations and add BP meds if still elevated.  3.  Thoracic aortic aneurysm: CT chest findings as above, annual follow-up recommended.  Will follow-up formal cardiology recommendations in AM.  4.  Palpitations: Currently in sinus rhythm with LVH pattern on EKG and telemetry.  Patient reports noticing irregular pulse with pauses and her Apple watch showing heart rate to 120s.  Monitor on telemetry here, will add low-dose Coreg with holding parameters.  May need Holter monitor at the time of discharge.  5. Prolonged QTc interval, hypokalemia, hypocalcemia: Replace potassium especially with ongoing diuresis. Replace Ca (ionized ca 0.9).  Will give IV magnesium and consider low-dose beta-blocker if blood pressure tolerates.  Goal to keep potassium >4, magnesium >2  DVT prophylaxis: Lovenox   Code Status:   Full code.Health care proxy would be her husband Marcey  Patient/Family Communication: Discussed with patient and all questions answered to satisfaction.  Consults called: Cardiology Admission status :Patient will be admitted under OBSERVATION status.The patient's presenting symptoms, physical exam findings, and initial radiographic and laboratory data in the context of their medical condition is felt to place them at low risk for further clinical deterioration. Furthermore, it is anticipated that the patient will be medically stable for discharge from the hospital within 2 midnights of hospital stay.       Sven Brocks  MD [facility] Pager in [place]  If 7PM-7AM, please contact night-coverage [email or web address]   04/27/2024, 1:46 AM

## 2024-04-27 NOTE — Significant Event (Signed)
 Patient's troponin went up to 124.  Patient asymptomatic.  Discussed with cardiologist Dr. Debarah at this time advised no indication for heparin.  Redia Cleaver.

## 2024-04-27 NOTE — TOC CM/SW Note (Signed)
 Transition of Care [facility]) - Inpatient Brief Assessment   Patient Details  Name: Jane Conley MRN: [id number] Date of Birth: Sep 15, 1955  Transition of [facility]) CM/SW Contact:    Waddell Barnie Rama, RN Phone Number: 04/27/2024, 4:00 PM   Clinical Narrative: From home with spouse, has PCP and insurance on file, states has no HH services in place at this time or DME at home.  States family member  (spouse) will transport them home at [employer] and family is support system, states gets medications from [facility] in [place].  Pta self ambulatory.   There are no ICM needs identified  at this time.  Please place consult for ICM needs.     Transition of Care Asessment: Insurance and Status: Insurance coverage has been reviewed Patient has primary care physician: Yes Home environment has been reviewed: home with spouse Prior level of function:: indep Prior/Current Home Services: No current home services Social Drivers of Health Review: SDOH reviewed no interventions necessary Readmission risk has been reviewed: Yes Transition of care needs: no transition of care needs at this time

## 2024-04-27 NOTE — ED Notes (Signed)
 [address] unit notified that patient is no her way up .

## 2024-04-28 ENCOUNTER — Encounter (HOSPITAL_COMMUNITY): Payer: Self-pay | Admitting: Cardiology

## 2024-04-28 ENCOUNTER — Other Ambulatory Visit: Payer: Self-pay

## 2024-04-28 DIAGNOSIS — I350 Nonrheumatic aortic (valve) stenosis: Secondary | ICD-10-CM

## 2024-04-28 LAB — BASIC METABOLIC PANEL WITH GFR
Anion gap: 8 (ref 5–15)
BUN: 18 mg/dL (ref 8–23)
CO2: 25 mmol/L (ref 22–32)
Calcium: 9 mg/dL (ref 8.9–10.3)
Chloride: 107 mmol/L (ref 98–111)
Creatinine, Ser: 1.22 mg/dL — ABNORMAL HIGH (ref 0.44–1.00)
GFR, Estimated: 48 mL/min — ABNORMAL LOW (ref >60)
Glucose, Bld: 112 mg/dL — ABNORMAL HIGH (ref 70–99)
Potassium: 4.8 mmol/L (ref 3.5–5.1)
Sodium: 140 mmol/L (ref 135–145)

## 2024-04-28 LAB — MAGNESIUM: Magnesium: 2.4 mg/dL (ref 1.7–2.4)

## 2024-04-28 MED ORDER — FUROSEMIDE 20 MG PO TABS
20.0000 mg | ORAL_TABLET | Freq: Every day | ORAL | Status: DC
  Administered 2024-04-28: 20 mg via ORAL
  Filled 2024-04-28: qty 1

## 2024-04-28 MED ORDER — FUROSEMIDE 20 MG PO TABS: 20.0000 mg | ORAL_TABLET | Freq: Every day | ORAL | 0 refills | Status: DC

## 2024-04-28 MED ORDER — CARVEDILOL 3.125 MG PO TABS: 3.1250 mg | ORAL_TABLET | Freq: Two times a day (BID) | ORAL | 0 refills | Status: DC

## 2024-04-28 MED ORDER — LISINOPRIL 10 MG PO TABS: 10.0000 mg | ORAL_TABLET | Freq: Every day | ORAL | 0 refills | Status: DC

## 2024-04-28 MED ORDER — EMPAGLIFLOZIN 10 MG PO TABS: 10.0000 mg | ORAL_TABLET | Freq: Every day | ORAL | 0 refills | Status: DC

## 2024-04-28 NOTE — Progress Notes (Signed)
 Discharge   Patient expressed verbal understanding of discharge POC.   Patient given time to ask any questions.  Additional education included in AVS.  Alert oriented in good spirits.   Tele and PIV removed by Primary RN. Pressure dressings intact.  Discharging to Entrance C.

## 2024-04-28 NOTE — Discharge Summary (Signed)
 Physician Discharge Summary   Patient: Jane Conley MRN: [id number] DOB: [birth year]  Admit date:     04/26/2024  Discharge date: 04/28/24  Discharge Physician: [name] Jane Conley   PCP: Shona Norleen PEDLAR, MD   Recommendations at discharge:  {Tip this will not be part of the note when signed- Example include specific recommendations for outpatient follow-up, pending tests to follow-up on. (Optional):26781} Follow up for Cardiology appointment, outpatient CTA and CT surgery appts.    Discharge Diagnoses: Principal Problem:   Acute diastolic CHF (congestive heart failure) (HCC) Active Problems:   Thoracic aortic aneurysm   HTN (hypertension)   Prolonged QT interval   Hypokalemia   Palpitations  Resolved Problems:   * No resolved hospital problems. [facility] Course: 68 y.o. female with history h/o hypertension, thoracic aortic dilatation 4.5 cm, moderate LVH with EF 50 to 55% on echo from 2017, moderate aortic stenosis presented via EMS with complaints of worsening shortness of breath with orthopnea over the last few months and worsening over the last couple of days.  Reports associated ankle edema, palpitation, heartbeat and pauses noted on her Apple Watch.   On EMS evaluation there was concern for significantly elevated BP, concern for ST-T changes on EKG , brought in as code STEMI and stat cardiology evaluation was obtained.  No complaints of chest pain on initial EMS evaluation and upon cardiology evaluation here.  She was saturating well on room air.  Patient afebrile, blood pressure elevated at 186/91, respiratory rate 20-27, pulse 80-90s    HS troponin elevated at 36, BNP 813, chest x-ray showed no evidence of pneumonia or pulmonary congestion but reported prominent right heart border suspicious for ascending aortic aneurysm (patient with known TAA).  Patient evaluated by cardiology who felt her presentation was consistent with acute CHF exacerbation likely in the setting of aortic  stenosis.  STEMI ruled out.  Bedside echo showed no wall motion abnormalities.  Elevated troponin felt to be demand ischemia and patient received IV Lasix , IV hydralazine  1 dose in the ED.  Patient's blood pressure has improved and requested to be admitted for further evaluation and management.     Assessment and Plan: 1.  Acute diastolic CHF exacerbation in the setting of valvular heart disease: Received  Lasix  40 mg IV daily--switched to lasix  20mg  PO Continue lisinopril  10 mg, Jardiance  10 mg. Echo- EF is now 40-45%, severe LVH, severe LV dilation, moderate to severe aortic stenosis with mild dilation of ascending aorta. Grade 1 diastolic dysfunction noted   Status post Cardiac Cath--shows no CAD, wedge 16    2.  Hypertension urgency/emergency:  Stable blood pressure. On lisinopril  10 mg.     3. Thoracic aortic aneurysm:  - Severe aortic stenosis Echo consistent with severe aortic stenosis and moderate aortic regurgitation with LV dysfunction. CT PE shows thoracic aneurysm  to be 4.4cm    4. Prolonged QTc interval, hypokalemia, hypocalcemia:Electrolytes replaced. Follow up with PCP for further monitoring.     {Tip this will not be part of the note when signed Body mass index is 30.87 kg/m. , ,  (Optional):26781}  {(NOTE) Pain control PDMP Statment (Optional):26782} Consultants: Cardiology Procedures performed: Cardiac Cath  Disposition: Home Diet recommendation:  Cardiac diet DISCHARGE MEDICATION: Allergies as of 04/28/2024       Reactions   Codeine Nausea And Vomiting, Swelling        Medication List     TAKE these medications    carvedilol  3.125 MG tablet Commonly known  as: COREG Take 1 tablet (3.125 mg total) by mouth 2 (two) times daily with a meal.   cholecalciferol 10 MCG (400 UNIT) Tabs tablet Commonly known as: VITAMIN D3 Take 400 Units by mouth in the morning.   Co Q 10 100 MG Caps Take 100 mg by mouth in the morning.   cyanocobalamin 1000 MCG  tablet Take 1,000 mcg by mouth in the morning.   diclofenac Sodium 1 % Gel Commonly known as: VOLTAREN Apply 2-4 g topically daily as needed (joint and back pain).   empagliflozin 10 MG Tabs tablet Commonly known as: JARDIANCE Take 1 tablet (10 mg total) by mouth daily. Start taking on: April 29, 2024   furosemide 20 MG tablet Commonly known as: LASIX Take 1 tablet (20 mg total) by mouth daily.   ibuprofen 200 MG tablet Commonly known as: ADVIL Take 600 mg by mouth every 6 (six) hours as needed for mild pain (pain score 1-3) or headache.   lisinopril 10 MG tablet Commonly known as: ZESTRIL Take 1 tablet (10 mg total) by mouth daily. Start taking on: April 29, 2024   MAGNESIUM GLYCINATE ADVANCED PO Take 200 mg by mouth 2 (two) times daily after a meal. Take 2 capsules (200mg ) by mouth twice per day for supplementation. CARDIO TABS MAGNESIUM PLUS (GLYCINATE+TAURATE).   Olopatadine HCl 0.2 % Soln Place 1 drop into both eyes daily as needed (allergies, eye irritations).   vitamin C 1000 MG tablet Take 1,000 mg by mouth daily.   zinc gluconate 50 MG tablet Take 50 mg by mouth in the morning.        Follow-up Information     Shona Norleen PEDLAR, MD Follow up on 05/07/2024.   Specialty: Internal Medicine Why: 11:00 am for hospital follow up Contact information: [address] [place] [facility] [address] [phone]                Discharge Exam: Filed Weights   04/27/24 0217 04/28/24 0329  Weight: 94.3 kg 92.1 kg   ***  Condition at discharge: {DC Condition:26389}  The results of significant diagnostics from this hospitalization (including imaging, microbiology, ancillary and laboratory) are listed below for reference.   Imaging Studies: CARDIAC CATHETERIZATION Result Date: 04/27/2024 Table formatting from the original result was not included. Images from the original result were not included. Dominance: Right   Documented Severe Aortic Stenosis by  Echocardiogram-Aortic Valve not crossed. Angiographically normal coronary arteries with a Right Dominant System Borderline Pulmonary Hypertension with PAP-mean 34/15-24 mmHg. PCWP 16 mmHg Cardiac Output-Index mildly reduced by Fick: 4.88-2.35  RECOMMENDATIONS   Anticipated discharge date to be determined.   Stabilize GDMT for HTN and HFpEF as well as aortic stenosis.  Continue to progress with workup for severe stenosis-pre-AVR.   No indication for antiplatelet therapy at this time . Alm Clay, MD   ECHOCARDIOGRAM COMPLETE Result Date: 04/27/2024    ECHOCARDIOGRAM REPORT   Patient Name:   Jane Conley Date of Exam: 04/27/2024 Medical Rec #:  [id number]    Height:       68.0 in Accession #:    [id number]   Weight:       207.8 lb Date of Birth:  1955-07-26    BSA:          2.077 m Patient Age:    68 years     BP:           139/80 mmHg Patient Gender: F  HR:           77 bpm. Exam Location:  Inpatient Procedure: 2D Echo, Cardiac Doppler, Color Doppler, Intracardiac Opacification            Agent and PEDOF (Both Spectral and Color Flow Doppler were utilized            during procedure). Indications:    CHF- Acute Diastolic I50.31  History:        Patient has prior history of Echocardiogram examinations, most                 recent 09/20/2015. CHF, Aortic Valve Disease,                 Signs/Symptoms:palpitations; Risk Factors:Hypertension and                 Dyslipidemia.  Sonographer:    Koleen Popper RDCS Referring Phys: [id number] SVEN BROCKS IMPRESSIONS  1. Left ventricular ejection fraction, by estimation, is 40 to 45%. The left ventricle has mildly decreased function. The left ventricle demonstrates global hypokinesis. The left ventricular internal cavity size was severely dilated. There is severe left ventricular hypertrophy. Left ventricular diastolic parameters are consistent with Grade I diastolic dysfunction (impaired relaxation).  2. Right ventricular systolic function is normal. The right  ventricular size is not well visualized.  3. The mitral valve is grossly normal. Trivial mitral valve regurgitation. No evidence of mitral stenosis.  4. Moderate aortic stenosis by mean gradient and DI, severe by peak velocity. Cannot fully determine morphology of valve but appears likely bicuspid. The aortic valve has an indeterminant number of cusps. There is moderate calcification of the aortic valve. Aortic valve regurgitation is mild to moderate. Moderate to severe aortic valve stenosis.  5. Aortic dilatation noted. There is mild dilatation of the aortic root, measuring 41 mm. There is mild dilatation of the ascending aorta, measuring 42 mm.  6. The inferior vena cava is normal in size with greater than 50% respiratory variability, suggesting right atrial pressure of 3 mmHg. Comparison(s): Changes from prior study are noted. Conclusion(s)/Recommendation(s): EF is now 40-45%, severe LVH, severe LV dilation, moderate to severe aortic stenosis (windows are bad but likely bicuspid) with mild dilation of ascending aorta. Findings communicated to Dr. Carlota and Dr. Wendel. FINDINGS  Left Ventricle: Left ventricular ejection fraction, by estimation, is 40 to 45%. The left ventricle has mildly decreased function. The left ventricle demonstrates global hypokinesis. Definity contrast agent was given IV to delineate the left ventricular  endocardial borders. The left ventricular internal cavity size was severely dilated. There is severe left ventricular hypertrophy. Left ventricular diastolic parameters are consistent with Grade I diastolic dysfunction (impaired relaxation). Right Ventricle: The right ventricular size is not well visualized. Right vetricular wall thickness was not well visualized. Right ventricular systolic function is normal. Left Atrium: Left atrial size was normal in size. Right Atrium: Right atrial size was not well visualized. Pericardium: There is no evidence of pericardial effusion. Mitral Valve:  The mitral valve is grossly normal. Trivial mitral valve regurgitation. No evidence of mitral valve stenosis. Tricuspid Valve: The tricuspid valve is not well visualized. Tricuspid valve regurgitation is not demonstrated. Aortic Valve: Moderate aortic stenosis by mean gradient and DI, severe by peak velocity. Cannot fully determine morphology of valve but appears likely bicuspid. The aortic valve has an indeterminant number of cusps. There is moderate calcification of the  aortic valve. Aortic valve regurgitation is mild to moderate. Moderate to severe aortic stenosis is  present. Aortic valve mean gradient measures 30.0 mmHg. Aortic valve peak gradient measures 68.9 mmHg. Aortic valve area, by VTI measures 1.15 cm. Pulmonic Valve: The pulmonic valve was not well visualized. Pulmonic valve regurgitation is mild. No evidence of pulmonic stenosis. Aorta: Aortic dilatation noted. There is mild dilatation of the aortic root, measuring 41 mm. There is mild dilatation of the ascending aorta, measuring 42 mm. Venous: The inferior vena cava is normal in size with greater than 50% respiratory variability, suggesting right atrial pressure of 3 mmHg. IAS/Shunts: The interatrial septum was not well visualized.  LEFT VENTRICLE PLAX 2D LVIDd:         6.10 cm   Diastology LVIDs:         4.60 cm   LV e' medial:    5.00 cm/s LV PW:         1.80 cm   LV E/e' medial:  8.5 LV IVS:        2.10 cm   LV e' lateral:   7.40 cm/s LVOT diam:     2.10 cm   LV E/e' lateral: 5.7 LV SV:         84 LV SV Index:   40 LVOT Area:     3.46 cm  RIGHT VENTRICLE             IVC RV S prime:     20.30 cm/s  IVC diam: 1.40 cm LEFT ATRIUM             Index LA diam:        4.00 cm 1.93 cm/m LA Vol (A2C):   45.9 ml 22.10 ml/m LA Vol (A4C):   39.2 ml 18.87 ml/m LA Biplane Vol: 42.4 ml 20.41 ml/m  AORTIC VALVE AV Area (Vmax):    0.95 cm AV Area (Vmean):   1.09 cm AV Area (VTI):     1.15 cm AV Vmax:           415.00 cm/s AV Vmean:          253.000 cm/s AV  VTI:            0.728 m AV Peak Grad:      68.9 mmHg AV Mean Grad:      30.0 mmHg LVOT Vmax:         113.50 cm/s LVOT Vmean:        79.450 cm/s LVOT VTI:          0.242 m LVOT/AV VTI ratio: 0.33  AORTA Ao Root diam: 4.10 cm Ao Asc diam:  4.20 cm MITRAL VALVE MV Area (PHT): 6.71 cm    SHUNTS MV Decel Time: 113 msec    Systemic VTI:  0.24 m MV E velocity: 42.40 cm/s  Systemic Diam: 2.10 cm MV A velocity: 98.00 cm/s MV E/A ratio:  0.43 Shelda Bruckner MD Electronically signed by Shelda Bruckner MD Signature Date/Time: 04/27/2024/11:22:29 AM    Final    CT Angio Chest/Abd/Pel for Dissection W and/or Wo Contrast Result Date: 04/27/2024 EXAM: CTA CHEST, ABDOMEN AND PELVIS WITHOUT AND WITH CONTRAST 04/27/2024 12:02:00 AM TECHNIQUE: CTA of the chest was performed without and with the administration of intravenous contrast. 100 mL (iohexol  (OMNIPAQUE ) 350 MG/ML injection 100 mL IOHEXOL  350 MG/ML SOLN) was administered. CTA of the abdomen and pelvis was performed with the administration of intravenous contrast. Multiplanar reformatted images are provided for review. MIP images are provided for review. Automated exposure control, iterative reconstruction, and/or weight based adjustment of the mA/kV  was utilized to reduce the radiation dose to as low as reasonably achievable. COMPARISON: Same day x-ray. CLINICAL HISTORY: Aortic aneurysm suspected. FINDINGS: VASCULATURE: AORTA: Ascending aortic aneurysm measuring 44 mm in maximum diameter. No dissection, intramural hematoma, or penetrating atherosclerotic ulcer. No abdominal aortic aneurysm. PULMONARY ARTERIES: No pulmonary embolism with the limits of this exam. GREAT VESSELS OF AORTIC ARCH: No acute finding. No dissection. No arterial occlusion or significant stenosis. CELIAC TRUNK: No acute finding. No occlusion or significant stenosis. SUPERIOR MESENTERIC ARTERY: No acute finding. No occlusion or significant stenosis. INFERIOR MESENTERIC ARTERY: No acute  finding. No occlusion or significant stenosis. RENAL ARTERIES: No acute finding. No occlusion or significant stenosis. ILIAC ARTERIES: No acute finding. No occlusion or significant stenosis. CHEST: MEDIASTINUM: Cardiomegaly. Aortic valve calcifications. No mediastinal lymphadenopathy. The pericardium demonstrates no acute abnormality. LUNGS AND PLEURA: Small bilateral pleural effusions. Patchy bilateral ground glass opacities and diffuse interlobular septal thickening. No pneumothorax. THORACIC BONES AND SOFT TISSUES: No acute bone or soft tissue abnormality. ABDOMEN AND PELVIS: LIVER: The liver is unremarkable. GALLBLADDER AND BILE DUCTS: Cholelithiasis. No evidence of acute cholecystitis. No biliary ductal dilatation. SPLEEN: The spleen is unremarkable. PANCREAS: The pancreas is unremarkable. ADRENAL GLANDS: Bilateral adrenal glands demonstrate no acute abnormality. KIDNEYS, URETERS AND BLADDER: No stones in the kidneys or ureters. No hydronephrosis. No perinephric or periureteral stranding. Urinary bladder is unremarkable. GI AND BOWEL: Stomach and duodenal sweep demonstrate no acute abnormality. Diverticulosis without diverticulitis. Normal appendix. There is no bowel obstruction. No abnormal bowel wall thickening or distension. REPRODUCTIVE: Reproductive organs are unremarkable. PERITONEUM AND RETROPERITONEUM: No ascites or free air. LYMPH NODES: No lymphadenopathy. ABDOMINAL BONES AND SOFT TISSUES: No acute abnormality of the bones. No acute soft tissue abnormality. IMPRESSION: 1. Findings compatible with CHF with pulmonary edema and small bilateral pleural effusions. 2. Ascending aortic aneurysm measuring 44 mm in maximum diameter. Recommend annual imaging followup by CTA or MRA. This recommendation follows 2010 ACCF/AHA/AATS/ACR/ASA/SCA/SCAI/SIR/STS/SVM Guidelines for the Diagnosis and Management of Patients with Thoracic Aortic Disease. Circulation. 2010; 121: Z733-z630. Aortic aneurysm NOS (ICD10-I71.9)  Electronically signed by: Norman Gatlin MD 04/27/2024 12:25 AM EST RP Workstation: [id number]   DG Chest 1 View Result Date: 04/26/2024 EXAM: 1 VIEW(S) XRAY OF THE CHEST 04/26/2024 10:58:00 PM COMPARISON: None available. CLINICAL HISTORY: Breath, shortness. FINDINGS: LUNGS AND PLEURA: No focal pulmonary opacity. No pleural effusion. No pneumothorax. HEART AND MEDIASTINUM: Cardiomegaly. Prominent right heart border suspicious for ascending aortic aneurysm. BONES AND SOFT TISSUES: No acute osseous abnormality. IMPRESSION: 1. Prominent right heart border suspicious for ascending aortic aneurysm. Further evaluation with CTA of the chest is recommended. Electronically signed by: Norman Gatlin MD 04/26/2024 11:10 PM EST RP Workstation: [id number]    Microbiology: No results found for this or any previous visit.  Labs: CBC: Recent Labs  Lab 04/26/24 2214 04/26/24 2224 04/27/24 0130 04/27/24 1339 04/27/24 1346  WBC 10.5  --  12.2*  --   --   HGB 15.7* 15.6* 16.0* 15.3* 15.3*  15.3*  HCT 47.2* 46.0 46.4* 45.0 45.0  45.0  MCV 96.7  --  93.4  --   --   PLT 232  --  231  --   --    Basic Metabolic Panel: Recent Labs  Lab 04/26/24 2214 04/26/24 2224 04/27/24 0130 04/27/24 0947 04/27/24 1339 04/27/24 1346 04/28/24 0249  NA 137 138  --  136 140 139  140 140  K 3.4* 3.4*  --  3.4* 3.5 3.4*  3.4* 4.8  CL 106  --   --  102  --   --  107  CO2 19*  --   --  22  --   --  25  GLUCOSE 115*  --   --  145*  --   --  112*  BUN 14  --   --  12  --   --  18  CREATININE 1.03*  --  0.86 0.93  --   --  1.22*  CALCIUM 8.8*  --   --  8.9  --   --  9.0  MG  --   --  1.9 2.2  --   --  2.4   Liver Function Tests: Recent Labs  Lab 04/26/24 2214  AST 28  ALT 24  ALKPHOS 68  BILITOT 1.1  PROT 7.1  ALBUMIN 3.8   CBG: No results for input(s): GLUCAP in the last 168 hours.  Discharge time spent: greater than 30 minutes.  Signed: Landon FORBES Baller, MD [facility] 04/28/2024

## 2024-04-28 NOTE — TOC Transition Note (Signed)
 Transition of Care [facility]) - Discharge Note   Patient Details  Name: Jane Conley MRN: [id number] Date of Birth: 09/26/1955  [facility] CM/SW Contact:  Waddell Barnie Rama, RN Phone Number: 04/28/2024, 10:50 AM   Clinical Narrative:    For dc today, has no needs.           Patient Goals and CMS Choice            Discharge Placement                       Discharge Plan and Services Additional resources added to the After Visit Summary for                                       Social Drivers of Health (SDOH) Interventions SDOH Screenings   Food Insecurity: No Food Insecurity (04/27/2024)  Housing: Low Risk  (04/27/2024)  Transportation Needs: No Transportation Needs (04/27/2024)  Utilities: Not At Risk (04/27/2024)  Social Connections: Moderately Integrated (04/27/2024)  Tobacco Use: Unknown (04/28/2024)     Readmission Risk Interventions    04/27/2024    3:59 PM  Readmission Risk Prevention Plan  Post Dischage Appt Complete  Medication Screening Complete  Transportation Screening Complete

## 2024-04-28 NOTE — Progress Notes (Signed)
  Progress Note  Patient Name: Jane Conley Date of Encounter: 04/28/2024 [facility] Cardiologist: Anner  Interval Summary   No acute events overnight.  Dyspnea better.  Mild dyspnea with exertion but improved versus prior to admission.  Cath yesterday without CAD, wedge , CO 4.8, CI 2.3  Vital Signs Vitals:   04/27/24 1939 04/27/24 2330 04/28/24 0329 04/28/24 0719  BP: 110/63 107/67 125/66 (!) 118/53  Pulse: 63 60 62 64  Resp: 19 18 17 20   Temp: 98.8 F (37.1 C) 98.4 F (36.9 C) 98 F (36.7 C) 97.8 F (36.6 C)  TempSrc: Oral Oral Oral Oral  SpO2: 95% 94% 95% 98%  Weight:   92.1 kg   Height:        Intake/Output Summary (Last 24 hours) at 04/28/2024 0834 Last data filed at 04/28/2024 9278 Gross per 24 hour  Intake 1227 ml  Output 1350 ml  Net -123 ml      04/28/2024    3:29 AM 04/27/2024    2:17 AM 09/19/2015    8:56 AM  Last 3 Weights  Weight (lbs) 203 lb 0.7 oz 207 lb 12.8 oz 193 lb  Weight (kg) 92.1 kg 94.257 kg 87.544 kg      Telemetry/ECG  NSR with IVCD - Personally Reviewed  Physical Exam  GEN: No acute distress.   Neck: No JVD Cardiac: RRR, 3/6 SEM, rubs, or gallops.  Respiratory: Clear to auscultation bilaterally. GI: Soft, nontender, non-distended  MS: No edema  Assessment & Plan  Hypertensive urgency/emergency:   BP stable now, continue lisinopril  10mg . Acute on chronic diastolic HF Euvolemic now Continue lisinopril  10mg , lasix  40mg , Jardiance  10mg  Aortic stenosis TTE 2017 AVA 1.2, MG 23, EF 50-55% Cath no CAD, wedge 16 Change lasix  to 20mg  PO Qday Thoracic aneurysm CT PE shows it only to be 4.4cm CKD IIIa Continue lisinopril  Jardiance  10mg   Cr mildly elevated, change to lasix  20mg  PO qday. Discussed with patient, discharge today with outpatient CTA and CT surgery appts.      For questions or updates, please contact [facility] Please consult [email or web address] for contact info under         Signed, Jane Conley  Jane Jayceion Lisenby, MD

## 2024-04-28 NOTE — Progress Notes (Signed)
 Heart Failure Navigator Progress Note  Assessed for Heart & Vascular TOC clinic readiness.  Patient does not meet criteria due to TAVR eval as outpatient, No HF TOC. .   Navigator will sign off at this time.   Stephane Haddock, BSN, Scientist, Clinical (histocompatibility And Immunogenetics) Only

## 2024-04-29 ENCOUNTER — Telehealth: Payer: Self-pay

## 2024-04-29 LAB — LIPOPROTEIN A (LPA): Lipoprotein (a): 27.9 nmol/L (ref <75.0)

## 2024-04-29 MED FILL — Verapamil HCl IV Soln 2.5 MG/ML: INTRAVENOUS | Qty: 2 | Status: CN

## 2024-04-29 MED FILL — Verapamil HCl IV Soln 2.5 MG/ML: INTRAVENOUS | Qty: 2 | Status: AC

## 2024-04-29 NOTE — Transitions of Care (Post Inpatient/ED Visit) (Signed)
   04/29/2024  Name: Jane Conley MRN: [id number] DOB: [birth year]  Today's TOC FU Call Status: Today's TOC FU Call Status:: Unsuccessful Call (1st Attempt) Unsuccessful Call (1st Attempt) Date: 04/29/24  Attempted to reach the patient regarding the most recent Inpatient/ED visit.  Follow Up Plan: Additional outreach attempts will be made to reach the patient to complete the Transitions of Care (Post Inpatient/ED visit) call.   Alan Ee, RN, BSN, CEN [employer]- Transition of Care Team.  [facility] [phone]

## 2024-04-30 ENCOUNTER — Telehealth: Payer: Self-pay

## 2024-04-30 NOTE — Transitions of Care (Post Inpatient/ED Visit) (Signed)
 04/30/2024  Name: Jane Conley MRN: [id number] DOB: 1956/05/02  Today's TOC FU Call Status: Today's TOC FU Call Status:: Successful TOC FU Call Completed TOC FU Call Complete Date: 04/30/24  Patient's Name and Date of Birth confirmed. Name, DOB  Transition Care Management Follow-up Telephone Call How have you been since you were released from the hospital?: Better Any questions or concerns?: No  Items Reviewed: Did you receive and understand the discharge instructions provided?: Yes Medications obtained,verified, and reconciled?: Yes (Medications Reviewed) Any new allergies since your discharge?: No Dietary orders reviewed?: Yes Type of Diet Ordered:: low salt heart healthy diet. Do you have support at home?: Yes People in Home [RPT]: spouse, child(ren), adult  Medications Reviewed Today: Medications Reviewed Today     Reviewed by Rumalda Alan PENNER, RN (Registered Nurse) on 04/30/24 at 1344  Med List Status: <None>   Medication Order Taking? Sig Documenting Provider Last Dose Status Informant  Ascorbic Acid (VITAMIN C) 1000 MG tablet [id number]  Take 1,000 mg by mouth daily.  Patient not taking: Reported on 04/30/2024   [provider]  Active Self, Pharmacy Records  carvedilol (COREG) 3.125 MG tablet [id number] Yes Take 1 tablet (3.125 mg total) by mouth 2 (two) times daily with a meal. Dibia, [name], MD  Active   cholecalciferol (VITAMIN D3) 10 MCG (400 UNIT) TABS tablet [id number]  Take 400 Units by mouth in the morning.  Patient not taking: Reported on 04/30/2024   [provider]  Active Self, Pharmacy Records  Coenzyme Q10 (CO Q 10) 100 MG CAPS [id number]  Take 100 mg by mouth in the morning.  Patient not taking: Reported on 04/30/2024   [provider]  Active Self, Pharmacy Records  cyanocobalamin 1000 MCG tablet [id number] Yes Take 1,000 mcg by mouth in the morning. [provider]  Active Self, Pharmacy Records  diclofenac Sodium  (VOLTAREN) 1 % GEL [id number]  Apply 2-4 g topically daily as needed (joint and back pain).  Patient not taking: Reported on 04/30/2024   [provider]  Active Self, Pharmacy Records  empagliflozin (JARDIANCE) 10 MG TABS tablet [id number] Yes Take 1 tablet (10 mg total) by mouth daily. Dibia, Pauline E, MD  Active   furosemide (LASIX) 20 MG tablet [id number] Yes Take 1 tablet (20 mg total) by mouth daily. Dibia, Pauline E, MD  Active   ibuprofen (ADVIL) 200 MG tablet [id number] Yes Take 600 mg by mouth every 6 (six) hours as needed for mild pain (pain score 1-3) or headache. [provider]  Active Self, Pharmacy Records  lisinopril (ZESTRIL) 10 MG tablet [id number] Yes Take 1 tablet (10 mg total) by mouth daily. DibiaLandon BRAVO, MD  Active   MAGNESIUM GLYCINATE ADVANCED PO [id number] Yes Take 200 mg by mouth 2 (two) times daily after a meal. Take 2 capsules (200mg ) by mouth twice per day for supplementation. CARDIO TABS MAGNESIUM PLUS (GLYCINATE+TAURATE). [provider]  Active Self, Pharmacy Records  Olopatadine HCl 0.2 % SOLN [id number] Yes Place 1 drop into both eyes daily as needed (allergies, eye irritations). [provider]  Active Self, Pharmacy Records  zinc gluconate 50 MG tablet [id number] Yes Take 50 mg by mouth in the morning. [provider]  Active Self, Pharmacy Records            Home Care and Equipment/Supplies: Were Home Health Services Ordered?: No Any new equipment or medical supplies ordered?: No  Functional Questionnaire: Do you need assistance with bathing/showering or  dressing?: No Do you need assistance with meal preparation?: No Do you need assistance with eating?: No Do you have difficulty maintaining continence: No Do you need assistance with getting out of bed/getting out of a chair/moving?: No Do you have difficulty managing or taking your medications?: No  Follow up appointments reviewed: PCP Follow-up appointment  confirmed?: Yes Date of PCP follow-up appointment?: 05/06/24 Follow-up Provider: Dr. Shona Driscilla Salvage Follow-up appointment confirmed?: Yes Date of Specialist follow-up appointment?: 05/07/24 Follow-Up Specialty Provider:: Cardiology Do you need transportation to your follow-up appointment?: No Do you understand care options if your condition(s) worsen?: Yes-patient verbalized understanding  SDOH Interventions Today    Flowsheet Row Most Recent Value  SDOH Interventions   Food Insecurity Interventions Intervention Not Indicated  Housing Interventions Intervention Not Indicated  Transportation Interventions Intervention Not Indicated  Utilities Interventions Intervention Not Indicated   Placed call to patient today and reviewed reason for call.  Explained to patient about heart failure. Reviewed low sat diet and low to read labels.  Discussed foods high in salt and what to avoid. Reviewed process of weighing with scale on a flat surface, first thing in the am with an empty bladder. Discussed heart failure zones and when to call MD. Reviewed importance of tracking weights, BP and pulse. Reviewed instruction on AVS regarding cardiac CT test tomorrow. Encouraged patient to call CT about not having Lopressor. She agreed to call.  Reviewed follow up appointment with Dr. Shona.  Confirmed patient has all her medications and is taking them as prescribed.  Offered support. Reviewed and offered 30 day TOC program and patient declined. Provided my contact information for patient to call back if needed. Reviewed with patient when to call 911 for an emergency.   Alan Ee, RN, BSN, CEN [employer]- Transition of Care Team.  [facility] [phone]

## 2024-04-30 NOTE — Telephone Encounter (Signed)
 Jane Conley called to ensure she took her betablocker correctly for her CT tomorrow as the nurse she spoke with today told her to take metoprolol and she does not have a prescription.  Instructed her to follow instructions on her CT letter and to take her carvedilol 2 hours prior to her CT. She will also bring her bottle with her in case she is instructed to take more.  She was grateful for assistance.

## 2024-05-01 ENCOUNTER — Inpatient Hospital Stay (HOSPITAL_COMMUNITY): Admit: 2024-05-01 | Discharge: 2024-05-01 | Attending: Cardiovascular Disease | Admitting: Cardiovascular Disease

## 2024-05-01 DIAGNOSIS — I350 Nonrheumatic aortic (valve) stenosis: Secondary | ICD-10-CM

## 2024-05-01 MED ORDER — IOHEXOL 350 MG/ML SOLN
100.0000 mL | Freq: Once | INTRAVENOUS | Status: AC | PRN
  Administered 2024-05-01: 100 mL via INTRAVENOUS

## 2024-05-07 ENCOUNTER — Institutional Professional Consult (permissible substitution)

## 2024-05-07 VITALS — BP 150/84 | HR 76 | Resp 20 | Ht 68.0 in | Wt 205.0 lb

## 2024-05-07 DIAGNOSIS — I350 Nonrheumatic aortic (valve) stenosis: Secondary | ICD-10-CM | POA: Diagnosis not present

## 2024-05-07 NOTE — Progress Notes (Unsigned)
 [address] [place] [address]             [phone]    Jane Conley [facility] Medical Record #[id number] Date of Birth: May 05, 1956  Referring: Jane Jane ORN, MD Primary Care: Shona Norleen PEDLAR, MD Primary Cardiologist:None  Chief Complaint:    Chief Complaint  Patient presents with   Aortic Stenosis    Surgical consult, ECHO and Cardiac Cath 04/27/24    History of Present Illness:     Jane Conley is a 68 y.o. female who presents for surgical evaluation of severe AS, mild-moderate AI and an ascending aortic aneurysm in the setting of a bicuspid aortic valve.  2 months ago she started having shortness of breath when walking on an incline (ok on flat surface).  Went to the hospital on 11/30 for shortness of breath.    She reports twinges of chest pain that comes and goes mostly in the evening.  Some dizziness just sitting, also has palpitations.  No syncope, no leg swelling.   Never smoker, no ETOH use.  Lives in a house with her husband.  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    Annulus: Area 767, dia 31.3, peri 101 mm SOV: R 35.7, L 38.6, N 39.4 Ascending aorta: 45 mm Distal ascending (right before innominate): 37 mm    LHC (04/27/24): Normal coronary arteries RHC (04/27/24): PAP 34/15 (24), PCW 16, CI 2.35 TTE (04/27/24): EF 40-45%, severe LVH and LV dilation, normal RV function, severe AS (Vmax 4.15, MG 30, AVA 1.15), DVI 0.33, mild-mod AI, Trivial MR, no MS Chest CT (***): *** PFT (***): ***  Past Medical and Surgical History: Previous Chest Surgery: no Previous Chest Radiation: no Diabetes Mellitus: No.  HbA1C N/A Anticoagulation: No, Last dose N/A  Creatinine:  Lab Results  Component Value Date   CREATININE 1.22 (H) 04/28/2024   CREATININE 0.93 04/27/2024   CREATININE 0.86 04/27/2024     No past medical history on file.  Past Surgical History:  Procedure Laterality Date   ABDOMINAL HYSTERECTOMY      leaking aortic valve     RIGHT HEART CATH AND CORONARY ANGIOGRAPHY N/A 04/27/2024   Procedure: RIGHT HEART CATH AND CORONARY ANGIOGRAPHY;  Surgeon: Jane Jane ORN, MD;  Location: [facility] INVASIVE CV LAB;  Service: Cardiovascular;  Laterality: N/A;    Social History:  Tobacco Use History[1]  Social History   Substance and Sexual Activity  Alcohol Use No   Alcohol/week: 0.0 standard drinks of alcohol     Allergies[2]  Medications: Asprin: *** Statin: *** Beta Blocker: *** Ace Inhibitor: *** Anti-Coagulation: ***  Current Outpatient Medications  Medication Sig Dispense Refill   carvedilol  (COREG ) 3.125 MG tablet Take 1 tablet (3.125 mg total) by mouth 2 (two) times daily with a meal. 30 tablet 0   cyanocobalamin 1000 MCG tablet Take 1,000 mcg by mouth in the morning.     empagliflozin  (JARDIANCE ) 10 MG TABS tablet Take 1 tablet (10 mg total) by mouth daily. 30 tablet 0   furosemide  (LASIX ) 20 MG tablet Take 1 tablet (20 mg total) by mouth daily. 30 tablet 0   ibuprofen (ADVIL) 200 MG tablet Take 600 mg by mouth every 6 (six) hours as needed for mild pain (pain score 1-3) or headache.     lisinopril  (ZESTRIL ) 10 MG tablet Take 1 tablet (10 mg total) by mouth daily. 30 tablet 0  MAGNESIUM  GLYCINATE ADVANCED PO Take 200 mg by mouth 2 (two) times daily after a meal. Take 2 capsules (200mg ) by mouth twice per day for supplementation. CARDIO TABS MAGNESIUM  PLUS (GLYCINATE+TAURATE).     Olopatadine HCl 0.2 % SOLN Place 1 drop into both eyes daily as needed (allergies, eye irritations).     zinc gluconate 50 MG tablet Take 50 mg by mouth in the morning.     Ascorbic Acid (VITAMIN C) 1000 MG tablet Take 1,000 mg by mouth daily. (Patient not taking: Reported on 05/07/2024)     cholecalciferol (VITAMIN D3) 10 MCG (400 UNIT) TABS tablet Take 400 Units by mouth in the morning. (Patient not taking: Reported on 05/07/2024)     Coenzyme Q10 (CO Q 10) 100 MG CAPS Take 100 mg by mouth in the  morning. (Patient not taking: Reported on 05/07/2024)     diclofenac Sodium (VOLTAREN) 1 % GEL Apply 2-4 g topically daily as needed (joint and back pain). (Patient not taking: Reported on 05/07/2024)     No current facility-administered medications for this visit.    (Not in a hospital admission)   No family history on file.   Review of Systems:   ROS    Physical Exam: BP (!) 150/84   Pulse 76   Resp 20   Ht 5' 8 (1.727 m)   Wt 205 lb (93 kg)   SpO2 96% Comment: RA  BMI 31.17 kg/m  Physical Exam    Diagnostic Studies & Laboratory data: Cardiac Studies & Procedures   ______________________________________________________________________________________________ CARDIAC CATHETERIZATION  CARDIAC CATHETERIZATION 04/27/2024  Conclusion Table formatting from the original result was not included. Images from the original result were not included. Dominance: Right Documented Severe Aortic Stenosis by Echocardiogram-Aortic Valve not crossed. Angiographically normal coronary arteries with a Right Dominant System Borderline Pulmonary Hypertension with PAP-mean 34/15-24 mmHg. PCWP 16 mmHg Cardiac Output-Index mildly reduced by Fick: 4.88-2.35  RECOMMENDATIONS   Anticipated discharge date to be determined.   Stabilize GDMT for HTN and HFpEF as well as aortic stenosis.  Continue to progress with workup for severe stenosis-pre-AVR.   No indication for antiplatelet therapy at this time .    Jane Clay, MD  Findings Coronary Findings Diagnostic  Dominance: Right  Left Main Vessel was injected. Vessel is normal in caliber. Vessel is angiographically normal.  Left Anterior Descending Vessel was injected. Vessel is large. Vessel is angiographically normal.  First Diagonal Branch Vessel was injected. Vessel is moderate in size. Vessel is angiographically normal.  Second Diagonal Branch Vessel was injected. Vessel is large in size. Vessel is angiographically  normal.  Left Circumflex Vessel was injected. Vessel is large. Vessel is angiographically normal.  Left Posterior Atrioventricular Artery Vessel was injected. Vessel is small in size. Vessel is angiographically normal.  Right Coronary Artery Vessel was injected. Vessel is large. Very large caliber. Vessel is angiographically normal. The vessel originates from the right coronary cusp. Lobe, downward takeoff The vessel is tortuous.  Right Posterior Atrioventricular Artery Vessel was injected. Vessel is large in size. Vessel is angiographically normal.  First Right Posterolateral Branch Vessel was injected. Vessel is large in size. Vessel is angiographically normal.  Intervention  No interventions have been documented.     ECHOCARDIOGRAM  ECHOCARDIOGRAM COMPLETE 04/27/2024  Narrative ECHOCARDIOGRAM REPORT    Patient Name:   Jane Conley Date of Exam: 04/27/2024 Medical Rec #:  [id number]    Height:       68.0 in Accession #:  [id number]   Weight:       207.8 lb Date of Birth:  12-01-1955    BSA:          2.077 m Patient Age:    68 years     BP:           139/80 mmHg Patient Gender: F            HR:           77 bpm. Exam Location:  Inpatient  Procedure: 2D Echo, Cardiac Doppler, Color Doppler, Intracardiac Opacification Agent and PEDOF (Both Spectral and Color Flow Doppler were utilized during procedure).  Indications:    CHF- Acute Diastolic I50.31  History:        Patient has prior history of Echocardiogram examinations, most recent 09/20/2015. CHF, Aortic Valve Disease, Signs/Symptoms:palpitations; Risk Factors:Hypertension and Dyslipidemia.  Sonographer:    Koleen Popper RDCS Referring Phys: [id number] Jane BROCKS  IMPRESSIONS   1. Left ventricular ejection fraction, by estimation, is 40 to 45%. The left ventricle has mildly decreased function. The left ventricle demonstrates global hypokinesis. The left ventricular internal cavity size was severely dilated.  There is severe left ventricular hypertrophy. Left ventricular diastolic parameters are consistent with Grade I diastolic dysfunction (impaired relaxation). 2. Right ventricular systolic function is normal. The right ventricular size is not well visualized. 3. The mitral valve is grossly normal. Trivial mitral valve regurgitation. No evidence of mitral stenosis. 4. Moderate aortic stenosis by mean gradient and DI, severe by peak velocity. Cannot fully determine morphology of valve but appears likely bicuspid. The aortic valve has an indeterminant number of cusps. There is moderate calcification of the aortic valve. Aortic valve regurgitation is mild to moderate. Moderate to severe aortic valve stenosis. 5. Aortic dilatation noted. There is mild dilatation of the aortic root, measuring 41 mm. There is mild dilatation of the ascending aorta, measuring 42 mm. 6. The inferior vena cava is normal in size with greater than 50% respiratory variability, suggesting right atrial pressure of 3 mmHg.  Comparison(s): Changes from prior study are noted.  Conclusion(s)/Recommendation(s): EF is now 40-45%, severe LVH, severe LV dilation, moderate to severe aortic stenosis (windows are bad but likely bicuspid) with mild dilation of ascending aorta. Findings communicated to Dr. Carlota and Dr. Wendel.  FINDINGS Left Ventricle: Left ventricular ejection fraction, by estimation, is 40 to 45%. The left ventricle has mildly decreased function. The left ventricle demonstrates global hypokinesis. Definity  contrast agent was given IV to delineate the left ventricular endocardial borders. The left ventricular internal cavity size was severely dilated. There is severe left ventricular hypertrophy. Left ventricular diastolic parameters are consistent with Grade I diastolic dysfunction (impaired relaxation).  Right Ventricle: The right ventricular size is not well visualized. Right vetricular wall thickness was not well  visualized. Right ventricular systolic function is normal.  Left Atrium: Left atrial size was normal in size.  Right Atrium: Right atrial size was not well visualized.  Pericardium: There is no evidence of pericardial effusion.  Mitral Valve: The mitral valve is grossly normal. Trivial mitral valve regurgitation. No evidence of mitral valve stenosis.  Tricuspid Valve: The tricuspid valve is not well visualized. Tricuspid valve regurgitation is not demonstrated.  Aortic Valve: Moderate aortic stenosis by mean gradient and DI, severe by peak velocity. Cannot fully determine morphology of valve but appears likely bicuspid. The aortic valve has an indeterminant number of cusps. There is moderate calcification of the aortic valve. Aortic valve regurgitation is mild to moderate.  Moderate to severe aortic stenosis is present. Aortic valve mean gradient measures 30.0 mmHg. Aortic valve peak gradient measures 68.9 mmHg. Aortic valve area, by VTI measures 1.15 cm.  Pulmonic Valve: The pulmonic valve was not well visualized. Pulmonic valve regurgitation is mild. No evidence of pulmonic stenosis.  Aorta: Aortic dilatation noted. There is mild dilatation of the aortic root, measuring 41 mm. There is mild dilatation of the ascending aorta, measuring 42 mm.  Venous: The inferior vena cava is normal in size with greater than 50% respiratory variability, suggesting right atrial pressure of 3 mmHg.  IAS/Shunts: The interatrial septum was not well visualized.   LEFT VENTRICLE PLAX 2D LVIDd:         6.10 cm   Diastology LVIDs:         4.60 cm   LV e' medial:    5.00 cm/s LV PW:         1.80 cm   LV E/e' medial:  8.5 LV IVS:        2.10 cm   LV e' lateral:   7.40 cm/s LVOT diam:     2.10 cm   LV E/e' lateral: 5.7 LV SV:         84 LV SV Index:   40 LVOT Area:     3.46 cm   RIGHT VENTRICLE             IVC RV S prime:     20.30 cm/s  IVC diam: 1.40 cm  LEFT ATRIUM             Index LA diam:         4.00 cm 1.93 cm/m LA Vol (A2C):   45.9 ml 22.10 ml/m LA Vol (A4C):   39.2 ml 18.87 ml/m LA Biplane Vol: 42.4 ml 20.41 ml/m AORTIC VALVE AV Area (Vmax):    0.95 cm AV Area (Vmean):   1.09 cm AV Area (VTI):     1.15 cm AV Vmax:           415.00 cm/s AV Vmean:          253.000 cm/s AV VTI:            0.728 m AV Peak Grad:      68.9 mmHg AV Mean Grad:      30.0 mmHg LVOT Vmax:         113.50 cm/s LVOT Vmean:        79.450 cm/s LVOT VTI:          0.242 m LVOT/AV VTI ratio: 0.33  AORTA Ao Root diam: 4.10 cm Ao Asc diam:  4.20 cm  MITRAL VALVE MV Area (PHT): 6.71 cm    SHUNTS MV Decel Time: 113 msec    Systemic VTI:  0.24 m MV E velocity: 42.40 cm/s  Systemic Diam: 2.10 cm MV A velocity: 98.00 cm/s MV E/A ratio:  0.43  Jane Bruckner MD Electronically signed by Jane Bruckner MD Signature Date/Time: 04/27/2024/11:22:29 AM    Final      CT SCANS  CT CORONARY MORPH W/CTA COR W/SCORE 05/01/2024  Addendum 05/01/2024  4:15 PM ADDENDUM REPORT: 05/01/2024 16:12  EXAM: OVER-READ INTERPRETATION  CT CHEST  The following report is an over-read performed by radiologist Jane Conley [facility] on 05/01/2024. This over-read does not include interpretation of cardiac or coronary anatomy or pathology. The cardiac CTA interpretation by the cardiologist is attached.  COMPARISON:  Same day CTA chest  FINDINGS: Please see  separately dictated concurrent CTA chest report for detailed findings.  IMPRESSION: Please see separately dictated concurrent CTA chest for detailed findings.   Electronically Signed By: Jane  Conley M.D. On: 05/01/2024 16:12  Narrative CLINICAL DATA:  Aortic Stenosis  EXAM: Cardiac TAVR CT  TECHNIQUE: The patient was scanned on a GE apex scanner. 1 beat acquisition triggered in the descending thoracic aorta at 110 HU's. A non contrast, gated CT scan was obtained first with axial slices of 2.5 mm through the heart  for valve scoring. A 120 kV retrospective, gated, contrast scan done with gantry rotation speed of 230 msec and collimation 0.63 mm. A delayed scan was obtained to exclude LAA thrombus. The 3D data set was reconstructed in 5% intervals of the R-R cycle. Best systolic phase was motion corrected Images were analyzed on a dedicated workstation using MPR, MIP and VRT modes The patient received 100 cc of contrast  FINDINGS: Aortic Valve: Functionally bicuspid with partial fusion of the right and left cusps Calcium  score 4535  Aorta: Mild calcific atherosclerosis with normal arch vessels No coarctation  Sino-tubular Junction: 33.7 mm  Ascending Thoracic Aorta: Dilated 4.5 cm  Aortic Arch:  29  Descending Thoracic Aorta: 29 mm  Sinus of Valsalva Measurements:  Non-coronary: 37.9 mm  Right - coronary: 35.5 mm  Left -   coronary: 36.5 mm  Coronary Artery Height above Annulus:  Left Main: 14.8 mm above annulus  Right Coronary: 18.1 mm above annulus  Virtual Basal Annulus Measurements: Measured in 25% phase  Maximum / Minimum Diameter: 33.4 mm x 28.2 mm average diameter 31.6 mm  Perimeter: 102 mm  Area: 785 mm2  Coronary Arteries: Sufficient height above annulus for deployment  Optimum Fluoroscopic Angle for Delivery: LAO 12 Caudal 27 degrees  Membranous septal length 15.3 mm  IMPRESSION: 1. Severely calcified functionally bicuspid AV with extremely high calcium  score of 4535. Annular area 785 mm2. Given these factors and dilated aorta at 4.5 cm with relatively young age suspect patient would benefit from SAVR with root replacement and not TAVR  2.  Coronary arteries sufficient height for deployment  3. Optimum angiographic angle for deployment LAO 12 Caudal 27 degrees  4.  Dilated ascending thoracic aorta 4.5 cm  Maude Emmer  Electronically Signed: By: Maude Emmer M.D. On: 05/01/2024 11:48      ______________________________________________________________________________________________     EKG: *** I have independently reviewed the above radiologic studies and discussed with the patient   Recent Lab Findings: Lab Results  Component Value Date   WBC 12.2 (H) 04/27/2024   HGB 15.3 (H) 04/27/2024   HGB 15.3 (H) 04/27/2024   HCT 45.0 04/27/2024   HCT 45.0 04/27/2024   PLT 231 04/27/2024   GLUCOSE 112 (H) 04/28/2024   ALT 24 04/26/2024   AST 28 04/26/2024   NA 140 04/28/2024   K 4.8 04/28/2024   CL 107 04/28/2024   CREATININE 1.22 (H) 04/28/2024   BUN 18 04/28/2024   CO2 25 04/28/2024   INR 1.0 04/26/2024   HGBA1C 5.4 04/27/2024    STS SCORE Procedure Type: Isolated AVR Perioperative Outcome Estimate % Operative Mortality 1.42% Morbidity & Mortality 5.4% Stroke 0.84% Renal Failure 0.43% Reoperation 2.64% Prolonged Ventilation 2.72% Deep Sternal Wound Infection 0.051% Long Hospital Stay (>14 days) 2.25% Short Hospital Stay (<6 days)* 56.2%    Assessment / Plan:   68 y.o. female with ***     I  spent {CHL ONC TIME VISIT - DTPQU:[id number]} counseling the patient face  to face.   Con RAMAN Denae Zulueta 05/07/2024 12:14 PM           [1]  Social History Tobacco Use  Smoking Status Never  Smokeless Tobacco Not on file  [2]  Allergies Allergen Reactions   Codeine Nausea And Vomiting and Swelling

## 2024-05-07 NOTE — H&P (View-Only) (Signed)
 "   [address] [place] [address]             [phone]    PRINCETTA UPLINGER [facility] Medical Record #[id number] Date of Birth: Sep 25, 1955  Referring: Anner Alm ORN, MD Primary Care: Shona Norleen PEDLAR, MD Primary Cardiologist:None  Chief Complaint:    Chief Complaint  Patient presents with   Aortic Stenosis    Surgical consult, ECHO and Cardiac Cath 04/27/24    History of Present Illness:     Jane Conley is a 68 y.o. female who presents for surgical evaluation of severe AS, mild-moderate AI and an ascending aortic aneurysm in the setting of a bicuspid aortic valve.  Her symptoms started about 2 months ago when she noticed worsening dyspnea with exertion, specifically when walking on an incline.  She was admitted on 11/30 with bad dyspnea and was found to have severe AS.  Further work up demonstrated a bicuspid valve, large annulus and 4.5 cm ascending aortic aneurysm.  She also has a history of HTN and has had difficulty controlling her BP but recently her SBP has been 130s at home.  She reports twinges of chest pain that come and go, mostly in the evening.  She has some dizziness with just sitting around and also has palpitations.  She denies syncope, leg swelling or history of stroke.   She is a never smoker and does not drink alcohol.  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    She was briefly discussed in TAVR conference last week and deemed a good surgical candidate given age and TAAA.  Annulus: Area 767, dia 31.3, peri 101 mm SOV: R 35.7, L 38.6, N 39.4 Ascending aorta: 45 mm Distal ascending (right before innominate): 37 mm    LHC (04/27/24): Normal coronary arteries RHC (04/27/24): PAP 34/15 (24), PCW 16, CI 2.35 TTE (04/27/24): EF 40-45%, severe LVH and LV dilation, normal RV function, severe AS (Vmax 4.15, MG 30, AVA 1.15), DVI 0.33, mild-mod AI, Trivial MR, no MS Chest  CT (05/01/24): Ascending aortic dilation up to 4.5 cm.  The root is not significant dilated but the aorta dilates immediately distal to STJ.   Past Medical and Surgical History: Previous Chest Surgery: no Previous Chest Radiation: no Diabetes Mellitus: No.  HbA1C N/A Anticoagulation: No, Last dose N/A  Creatinine:  Lab Results  Component Value Date   CREATININE 1.22 (H) 04/28/2024   CREATININE 0.93 04/27/2024   CREATININE 0.86 04/27/2024     No past medical history on file.  Past Surgical History:  Procedure Laterality Date   ABDOMINAL HYSTERECTOMY     leaking aortic valve     RIGHT HEART CATH AND CORONARY ANGIOGRAPHY N/A 04/27/2024   Procedure: RIGHT HEART CATH AND CORONARY ANGIOGRAPHY;  Surgeon: Anner Alm ORN, MD;  Location: [facility] INVASIVE CV LAB;  Service: Cardiovascular;  Laterality: N/A;    Social History:  Tobacco Use History[1]  Social History   Substance and Sexual Activity  Alcohol Use No   Alcohol/week: 0.0 standard drinks of alcohol     Allergies[2]  Medications: Asprin: No Statin: No Beta Blocker: Yes Ace Inhibitor: Yes Anti-Coagulation: No  Current Outpatient Medications  Medication Sig Dispense Refill   carvedilol  (COREG ) 3.125 MG tablet Take 1 tablet (3.125 mg total) by mouth 2 (two) times daily with a meal. 30 tablet 0  cyanocobalamin 1000 MCG tablet Take 1,000 mcg by mouth in the morning.     empagliflozin  (JARDIANCE ) 10 MG TABS tablet Take 1 tablet (10 mg total) by mouth daily. 30 tablet 0   furosemide  (LASIX ) 20 MG tablet Take 1 tablet (20 mg total) by mouth daily. 30 tablet 0   ibuprofen (ADVIL) 200 MG tablet Take 600 mg by mouth every 6 (six) hours as needed for mild pain (pain score 1-3) or headache.     lisinopril  (ZESTRIL ) 10 MG tablet Take 1 tablet (10 mg total) by mouth daily. 30 tablet 0   MAGNESIUM  GLYCINATE ADVANCED PO Take 200 mg by mouth 2 (two) times daily after a meal. Take 2 capsules (200mg ) by mouth twice per day for  supplementation. CARDIO TABS MAGNESIUM  PLUS (GLYCINATE+TAURATE).     Olopatadine HCl 0.2 % SOLN Place 1 drop into both eyes daily as needed (allergies, eye irritations).     zinc gluconate 50 MG tablet Take 50 mg by mouth in the morning.     Ascorbic Acid (VITAMIN C) 1000 MG tablet Take 1,000 mg by mouth daily. (Patient not taking: Reported on 05/07/2024)     cholecalciferol (VITAMIN D3) 10 MCG (400 UNIT) TABS tablet Take 400 Units by mouth in the morning. (Patient not taking: Reported on 05/07/2024)     Coenzyme Q10 (CO Q 10) 100 MG CAPS Take 100 mg by mouth in the morning. (Patient not taking: Reported on 05/07/2024)     diclofenac Sodium (VOLTAREN) 1 % GEL Apply 2-4 g topically daily as needed (joint and back pain). (Patient not taking: Reported on 05/07/2024)     No current facility-administered medications for this visit.    (Not in a hospital admission)   No family history on file.   Review of Systems:   Review of Systems  Constitutional:  Positive for malaise/fatigue. Negative for weight loss.  Respiratory:  Positive for shortness of breath.   Cardiovascular:  Positive for chest pain and palpitations. Negative for leg swelling.  Gastrointestinal:  Negative for abdominal pain, nausea and vomiting.  Neurological:  Positive for dizziness. Negative for headaches.      Physical Exam: BP (!) 150/84   Pulse 76   Resp 20   Ht 5' 8 (1.727 m)   Wt 205 lb (93 kg)   SpO2 96% Comment: RA  BMI 31.17 kg/m  Physical Exam Constitutional:      Appearance: Normal appearance.  HENT:     Head: Normocephalic and atraumatic.  Cardiovascular:     Rate and Rhythm: Normal rate and regular rhythm.     Heart sounds: Murmur heard.  Pulmonary:     Effort: Pulmonary effort is normal.     Breath sounds: Normal breath sounds.  Abdominal:     General: There is no distension.     Palpations: Abdomen is soft.  Skin:    General: Skin is warm and dry.  Neurological:     General: No focal  deficit present.     Mental Status: She is alert and oriented to person, place, and time.       Diagnostic Studies & Laboratory data: Cardiac Studies & Procedures   ______________________________________________________________________________________________ CARDIAC CATHETERIZATION  CARDIAC CATHETERIZATION 04/27/2024  Conclusion Table formatting from the original result was not included. Images from the original result were not included. Dominance: Right Documented Severe Aortic Stenosis by Echocardiogram-Aortic Valve not crossed. Angiographically normal coronary arteries with a Right Dominant System Borderline Pulmonary Hypertension with PAP-mean 34/15-24 mmHg. PCWP 16 mmHg  Cardiac Output-Index mildly reduced by Fick: 4.88-2.35  RECOMMENDATIONS   Anticipated discharge date to be determined.   Stabilize GDMT for HTN and HFpEF as well as aortic stenosis.  Continue to progress with workup for severe stenosis-pre-AVR.   No indication for antiplatelet therapy at this time .    Alm Clay, MD  Findings Coronary Findings Diagnostic  Dominance: Right  Left Main Vessel was injected. Vessel is normal in caliber. Vessel is angiographically normal.  Left Anterior Descending Vessel was injected. Vessel is large. Vessel is angiographically normal.  First Diagonal Branch Vessel was injected. Vessel is moderate in size. Vessel is angiographically normal.  Second Diagonal Branch Vessel was injected. Vessel is large in size. Vessel is angiographically normal.  Left Circumflex Vessel was injected. Vessel is large. Vessel is angiographically normal.  Left Posterior Atrioventricular Artery Vessel was injected. Vessel is small in size. Vessel is angiographically normal.  Right Coronary Artery Vessel was injected. Vessel is large. Very large caliber. Vessel is angiographically normal. The vessel originates from the right coronary cusp. Lobe, downward takeoff The vessel is  tortuous.  Right Posterior Atrioventricular Artery Vessel was injected. Vessel is large in size. Vessel is angiographically normal.  First Right Posterolateral Branch Vessel was injected. Vessel is large in size. Vessel is angiographically normal.  Intervention  No interventions have been documented.     ECHOCARDIOGRAM  ECHOCARDIOGRAM COMPLETE 04/27/2024  Narrative ECHOCARDIOGRAM REPORT    Patient Name:   TWYLIA OKA Date of Exam: 04/27/2024 Medical Rec #:  [id number]    Height:       68.0 in Accession #:    [id number]   Weight:       207.8 lb Date of Birth:  November 01, 1955    BSA:          2.077 m Patient Age:    68 years     BP:           139/80 mmHg Patient Gender: F            HR:           77 bpm. Exam Location:  Inpatient  Procedure: 2D Echo, Cardiac Doppler, Color Doppler, Intracardiac Opacification Agent and PEDOF (Both Spectral and Color Flow Doppler were utilized during procedure).  Indications:    CHF- Acute Diastolic I50.31  History:        Patient has prior history of Echocardiogram examinations, most recent 09/20/2015. CHF, Aortic Valve Disease, Signs/Symptoms:palpitations; Risk Factors:Hypertension and Dyslipidemia.  Sonographer:    Koleen Popper RDCS Referring Phys: [id number] SVEN BROCKS  IMPRESSIONS   1. Left ventricular ejection fraction, by estimation, is 40 to 45%. The left ventricle has mildly decreased function. The left ventricle demonstrates global hypokinesis. The left ventricular internal cavity size was severely dilated. There is severe left ventricular hypertrophy. Left ventricular diastolic parameters are consistent with Grade I diastolic dysfunction (impaired relaxation). 2. Right ventricular systolic function is normal. The right ventricular size is not well visualized. 3. The mitral valve is grossly normal. Trivial mitral valve regurgitation. No evidence of mitral stenosis. 4. Moderate aortic stenosis by mean gradient and DI, severe by  peak velocity. Cannot fully determine morphology of valve but appears likely bicuspid. The aortic valve has an indeterminant number of cusps. There is moderate calcification of the aortic valve. Aortic valve regurgitation is mild to moderate. Moderate to severe aortic valve stenosis. 5. Aortic dilatation noted. There is mild dilatation of the aortic root, measuring 41 mm. There is mild dilatation of  the ascending aorta, measuring 42 mm. 6. The inferior vena cava is normal in size with greater than 50% respiratory variability, suggesting right atrial pressure of 3 mmHg.  Comparison(s): Changes from prior study are noted.  Conclusion(s)/Recommendation(s): EF is now 40-45%, severe LVH, severe LV dilation, moderate to severe aortic stenosis (windows are bad but likely bicuspid) with mild dilation of ascending aorta. Findings communicated to Dr. Carlota and Dr. Wendel.  FINDINGS Left Ventricle: Left ventricular ejection fraction, by estimation, is 40 to 45%. The left ventricle has mildly decreased function. The left ventricle demonstrates global hypokinesis. Definity  contrast agent was given IV to delineate the left ventricular endocardial borders. The left ventricular internal cavity size was severely dilated. There is severe left ventricular hypertrophy. Left ventricular diastolic parameters are consistent with Grade I diastolic dysfunction (impaired relaxation).  Right Ventricle: The right ventricular size is not well visualized. Right vetricular wall thickness was not well visualized. Right ventricular systolic function is normal.  Left Atrium: Left atrial size was normal in size.  Right Atrium: Right atrial size was not well visualized.  Pericardium: There is no evidence of pericardial effusion.  Mitral Valve: The mitral valve is grossly normal. Trivial mitral valve regurgitation. No evidence of mitral valve stenosis.  Tricuspid Valve: The tricuspid valve is not well visualized. Tricuspid  valve regurgitation is not demonstrated.  Aortic Valve: Moderate aortic stenosis by mean gradient and DI, severe by peak velocity. Cannot fully determine morphology of valve but appears likely bicuspid. The aortic valve has an indeterminant number of cusps. There is moderate calcification of the aortic valve. Aortic valve regurgitation is mild to moderate. Moderate to severe aortic stenosis is present. Aortic valve mean gradient measures 30.0 mmHg. Aortic valve peak gradient measures 68.9 mmHg. Aortic valve area, by VTI measures 1.15 cm.  Pulmonic Valve: The pulmonic valve was not well visualized. Pulmonic valve regurgitation is mild. No evidence of pulmonic stenosis.  Aorta: Aortic dilatation noted. There is mild dilatation of the aortic root, measuring 41 mm. There is mild dilatation of the ascending aorta, measuring 42 mm.  Venous: The inferior vena cava is normal in size with greater than 50% respiratory variability, suggesting right atrial pressure of 3 mmHg.  IAS/Shunts: The interatrial septum was not well visualized.   LEFT VENTRICLE PLAX 2D LVIDd:         6.10 cm   Diastology LVIDs:         4.60 cm   LV e' medial:    5.00 cm/s LV PW:         1.80 cm   LV E/e' medial:  8.5 LV IVS:        2.10 cm   LV e' lateral:   7.40 cm/s LVOT diam:     2.10 cm   LV E/e' lateral: 5.7 LV SV:         84 LV SV Index:   40 LVOT Area:     3.46 cm   RIGHT VENTRICLE             IVC RV S prime:     20.30 cm/s  IVC diam: 1.40 cm  LEFT ATRIUM             Index LA diam:        4.00 cm 1.93 cm/m LA Vol (A2C):   45.9 ml 22.10 ml/m LA Vol (A4C):   39.2 ml 18.87 ml/m LA Biplane Vol: 42.4 ml 20.41 ml/m AORTIC VALVE AV Area (Vmax):    0.95  cm AV Area (Vmean):   1.09 cm AV Area (VTI):     1.15 cm AV Vmax:           415.00 cm/s AV Vmean:          253.000 cm/s AV VTI:            0.728 m AV Peak Grad:      68.9 mmHg AV Mean Grad:      30.0 mmHg LVOT Vmax:         113.50 cm/s LVOT Vmean:         79.450 cm/s LVOT VTI:          0.242 m LVOT/AV VTI ratio: 0.33  AORTA Ao Root diam: 4.10 cm Ao Asc diam:  4.20 cm  MITRAL VALVE MV Area (PHT): 6.71 cm    SHUNTS MV Decel Time: 113 msec    Systemic VTI:  0.24 m MV E velocity: 42.40 cm/s  Systemic Diam: 2.10 cm MV A velocity: 98.00 cm/s MV E/A ratio:  0.43  Shelda Bruckner MD Electronically signed by Shelda Bruckner MD Signature Date/Time: 04/27/2024/11:22:29 AM    Final      CT SCANS  CT CORONARY MORPH W/CTA COR W/SCORE 05/01/2024  Addendum 05/01/2024  4:15 PM ADDENDUM REPORT: 05/01/2024 16:12  EXAM: OVER-READ INTERPRETATION  CT CHEST  The following report is an over-read performed by radiologist Dr. Manford Breaker [facility] on 05/01/2024. This over-read does not include interpretation of cardiac or coronary anatomy or pathology. The cardiac CTA interpretation by the cardiologist is attached.  COMPARISON:  Same day CTA chest  FINDINGS: Please see separately dictated concurrent CTA chest report for detailed findings.  IMPRESSION: Please see separately dictated concurrent CTA chest for detailed findings.   Electronically Signed By: Limin  Xu M.D. On: 05/01/2024 16:12  Narrative CLINICAL DATA:  Aortic Stenosis  EXAM: Cardiac TAVR CT  TECHNIQUE: The patient was scanned on a GE apex scanner. 1 beat acquisition triggered in the descending thoracic aorta at 110 HU's. A non contrast, gated CT scan was obtained first with axial slices of 2.5 mm through the heart for valve scoring. A 120 kV retrospective, gated, contrast scan done with gantry rotation speed of 230 msec and collimation 0.63 mm. A delayed scan was obtained to exclude LAA thrombus. The 3D data set was reconstructed in 5% intervals of the R-R cycle. Best systolic phase was motion corrected Images were analyzed on a dedicated workstation using MPR, MIP and VRT modes The patient received 100 cc of  contrast  FINDINGS: Aortic Valve: Functionally bicuspid with partial fusion of the right and left cusps Calcium  score 4535  Aorta: Mild calcific atherosclerosis with normal arch vessels No coarctation  Sino-tubular Junction: 33.7 mm  Ascending Thoracic Aorta: Dilated 4.5 cm  Aortic Arch:  29  Descending Thoracic Aorta: 29 mm  Sinus of Valsalva Measurements:  Non-coronary: 37.9 mm  Right - coronary: 35.5 mm  Left -   coronary: 36.5 mm  Coronary Artery Height above Annulus:  Left Main: 14.8 mm above annulus  Right Coronary: 18.1 mm above annulus  Virtual Basal Annulus Measurements: Measured in 25% phase  Maximum / Minimum Diameter: 33.4 mm x 28.2 mm average diameter 31.6 mm  Perimeter: 102 mm  Area: 785 mm2  Coronary Arteries: Sufficient height above annulus for deployment  Optimum Fluoroscopic Angle for Delivery: LAO 12 Caudal 27 degrees  Membranous septal length 15.3 mm  IMPRESSION: 1. Severely calcified functionally bicuspid AV with extremely high calcium   score of 4535. Annular area 785 mm2. Given these factors and dilated aorta at 4.5 cm with relatively young age suspect patient would benefit from SAVR with root replacement and not TAVR  2.  Coronary arteries sufficient height for deployment  3. Optimum angiographic angle for deployment LAO 12 Caudal 27 degrees  4.  Dilated ascending thoracic aorta 4.5 cm  Maude Emmer  Electronically Signed: By: Maude Emmer M.D. On: 05/01/2024 11:48     ______________________________________________________________________________________________     EKG: NSR I have independently reviewed the above radiologic studies and discussed with the patient   Recent Lab Findings: Lab Results  Component Value Date   WBC 12.2 (H) 04/27/2024   HGB 15.3 (H) 04/27/2024   HGB 15.3 (H) 04/27/2024   HCT 45.0 04/27/2024   HCT 45.0 04/27/2024   PLT 231 04/27/2024   GLUCOSE 112 (H) 04/28/2024   ALT 24 04/26/2024    AST 28 04/26/2024   NA 140 04/28/2024   K 4.8 04/28/2024   CL 107 04/28/2024   CREATININE 1.22 (H) 04/28/2024   BUN 18 04/28/2024   CO2 25 04/28/2024   INR 1.0 04/26/2024   HGBA1C 5.4 04/27/2024    STS SCORE Procedure Type: Isolated AVR Perioperative Outcome Estimate % Operative Mortality 1.42% Morbidity & Mortality 5.4% Stroke 0.84% Renal Failure 0.43% Reoperation 2.64% Prolonged Ventilation 2.72% Deep Sternal Wound Infection 0.051% Long Hospital Stay (>14 days) 2.25% Short Hospital Stay (<6 days)* 56.2%    Assessment / Plan:   Ms. Sailer is a pleasant 68 year old woman with no significant past medical history, who was recently admitted with acute decompensated heart failure in the setting of severe aortic stenosis and reduced EF 40-45%.  She was found to have a bicuspid valve with enlarged thoracic ascending aortic aneurysm.  In discussing the options, I explained that there are three potentially options for her, and listed the risks and benefits associated with each: TAVR alone and surveillance of the aortic aneurysm  Proctor review of her TAVR imaging by [facility].  Moderate calcium  load on leaflets with big annulus.  Annulus is bigger than IFU with a 29 valve.  IF SURGERY IS CONTRA-INDICATED, could proceed with 29 S3UR with 5 cc added Given her large annulus and moderate calcium  load, she is at significant risk for PVL and valve embolization. Surveillance of her aneurysm would be acceptable, but I quoted her a 30% lifetime risk of an aortic complication if it remains unrepaired, particularly because she has had trouble with BP control in the past. She will almost certainly need another procedure in the future when her TAVR valve fails, and that may be ViV TAVR vs SVAR/aortic replacement at that time.   SAVR alone Similar concerns about not fixing the aorta but I suspect her SAVR valve would last longer than her TAVR valve with almost no risk of PVL or embolization.   Based on  her anatomy, she would likely be a candidate for ViV TAVR if need be in the future SAVR + ascending/hemiarch aorta replacement, possible [place] This would address her aortic stenosis and aortic aneurysm, however would obviously be higher risk than TAVR or SAVR alone Patient is very reluctant about having surgery which is understandable.  The patient strongly prefers to avoid surgery, if possible.  She has transfemoral acces for TAVR but I worry that she will not have a good result with TAVR, nor will it address her aortic aneurysm.    I discussed the general nature of the procedure,  including the need for general anesthesia, the incisions to be used, the use of cardiopulmonary bypass, and the use of temporary pacemaker wires and drainage tubes postoperatively with the Ms. Castner.  We discussed the expected hospital stay, overall recovery and short and long term outcomes. I informed her of the indications, risks, benefits and alternatives.   She understands the risks include, but are not limited to death, stroke, MI, DVT/PE, bleeding, possible need for transfusion, infections, cardiac arrhythmias, as well as other organ system dysfunction including respiratory (eg: prolonged ventilation), renal, or GI complications.   The pros and cons of a biological vs mechanical valve were discussed.  After joint discussion between the patient and myself, we have decided on implantation of a biologic valve.    At the end of our discussion, she has decided to take some time to think about her options and discuss them with her family.  In the meantime, I will re-present her case at valve conference on Tuesday and then call her afterwards to discuss the recommendation of the multi-disciplinary team.  I  spent 60 minutes counseling the patient face to face.   Con RAMAN Thessaly Mccullers 05/07/2024 12:14 PM           [1]  Social History Tobacco Use  Smoking Status Never  Smokeless Tobacco Not on file  [2]   Allergies Allergen Reactions   Codeine Nausea And Vomiting and Swelling   "

## 2024-05-12 ENCOUNTER — Other Ambulatory Visit: Payer: Self-pay | Admitting: *Deleted

## 2024-05-12 ENCOUNTER — Encounter: Payer: Self-pay | Admitting: *Deleted

## 2024-05-12 DIAGNOSIS — I712 Thoracic aortic aneurysm, without rupture, unspecified: Secondary | ICD-10-CM

## 2024-05-12 DIAGNOSIS — I351 Nonrheumatic aortic (valve) insufficiency: Secondary | ICD-10-CM

## 2024-05-12 DIAGNOSIS — I350 Nonrheumatic aortic (valve) stenosis: Secondary | ICD-10-CM

## 2024-06-02 ENCOUNTER — Encounter (HOSPITAL_COMMUNITY): Payer: Self-pay

## 2024-06-02 NOTE — Progress Notes (Signed)
 Surgical Instructions   Your procedure is scheduled on Friday June 05, 2024. Report to [facility] Main Entrance A at 5:30 A.M., then check in with the Admitting office. Any questions or running late day of surgery: call [phone]  Questions prior to your surgery date: call [phone], Monday-Friday, 8am-4pm. If you experience any cold or flu symptoms such as cough, fever, chills, shortness of breath, etc. between now and your scheduled surgery, please notify us  at the above number.     Remember:  Do not eat or drink after midnight the night before your surgery  Take these medicines the morning of surgery with A SIP OF WATER   carvedilol  (COREG )   May take these medicines IF NEEDED: albuterol (VENTOLIN HFA) 108 (90 Base) MCG/ACT inhaler Please bring with you to the hospital Olopatadine HCl 0.2 % SOLN    PER YOUR SURGEON'S INSTRUCTIONS, PLEASE HOLD YOUR empagliflozin  (JARDIANCE )  3 DAYS PRIOR TO SURGERY WITH THE LAST DOSE BEING 06/01/2024.    One week prior to surgery, STOP taking any Aspirin  (unless otherwise instructed by your surgeon) Aleve, Naproxen, Ibuprofen, Motrin, Advil, Goody's, BC's, all herbal medications, fish oil, and non-prescription vitamins.                     Do NOT Smoke (Tobacco/Vaping) for 24 hours prior to your procedure.  If you use a CPAP at night, you may bring your mask/headgear for your overnight stay.   You will be asked to remove any contacts, glasses, piercing's, hearing aid's, dentures/partials prior to surgery. Please bring cases for these items if needed.    Patients discharged the day of surgery will not be allowed to drive home, and someone needs to stay with them for 24 hours.  SURGICAL WAITING ROOM VISITATION Patients may have no more than 2 support people in the waiting area - these visitors may rotate.   Pre-op nurse will coordinate an appropriate time for 1 ADULT support person, who may not rotate, to accompany patient in pre-op.   Children under the age of 68 must have an adult with them who is not the patient and must remain in the main waiting area with an adult.  If the patient needs to stay at the hospital during part of their recovery, the visitor guidelines for inpatient rooms apply.  Please refer to the [facility] website for the visitor guidelines for any additional information.   If you received a COVID test during your pre-op visit  it is requested that you wear a mask when out in public, stay away from anyone that may not be feeling well and notify your surgeon if you develop symptoms. If you have been in contact with anyone that has tested positive in the last 10 days please notify you surgeon.      Pre-operative CHG Bathing Instructions   You can play a key role in reducing the risk of infection after surgery. Your skin needs to be as free of germs as possible. You can reduce the number of germs on your skin by washing with CHG (chlorhexidine gluconate) soap before surgery. CHG is an antiseptic soap that kills germs and continues to kill germs even after washing.   DO NOT use if you have an allergy to chlorhexidine/CHG or antibacterial soaps. If your skin becomes reddened or irritated, stop using the CHG and notify one of our RNs at [phone].              TAKE A SHOWER THE  NIGHT BEFORE SURGERY   Please keep in mind the following:  DO NOT shave, including legs and underarms, 48 hours prior to surgery.   Place clean sheets on your bed the night before surgery Use a clean washcloth (not used since being washed) for shower. DO NOT sleep with pet's night before surgery.  CHG Shower Instructions:  Wash your face and private area with normal soap. If you choose to wash your hair, wash first with your normal shampoo.  After you use shampoo/soap, rinse your hair and body thoroughly to remove shampoo/soap residue.  Turn the water  OFF and apply half the bottle of CHG soap to a CLEAN washcloth.  Apply CHG  soap ONLY FROM YOUR NECK DOWN TO YOUR TOES (washing for 3-5 minutes)  DO NOT use CHG soap on face, private areas, open wounds, or sores.  Pay special attention to the area where your surgery is being performed.  If you are having back surgery, having someone wash your back for you may be helpful. Wait 2 minutes after CHG soap is applied, then you may rinse off the CHG soap.  Pat dry with a clean towel  Put on clean pajamas    Additional instructions for the day of surgery: If you choose, you may shower the morning of surgery with an antibacterial soap.  DO NOT APPLY any lotions, deodorants  or perfumes.   Do not wear jewelry or makeup Do not wear nail polish, gel polish, artificial nails, or any other type of covering on natural nails (fingers and toes) Do not bring valuables to the hospital. [facility] is not responsible for valuables/personal belongings. Put on clean/comfortable clothes.  Please brush your teeth.  Ask your nurse before applying any prescription medications to the skin.

## 2024-06-03 ENCOUNTER — Other Ambulatory Visit: Payer: Self-pay

## 2024-06-03 ENCOUNTER — Encounter (HOSPITAL_COMMUNITY): Payer: Self-pay

## 2024-06-03 ENCOUNTER — Encounter (HOSPITAL_COMMUNITY)
Admission: RE | Admit: 2024-06-03 | Discharge: 2024-06-03 | Disposition: A | Discharge status: Home / Self Care | Source: Ambulatory Visit

## 2024-06-03 ENCOUNTER — Ambulatory Visit (HOSPITAL_COMMUNITY)
Admission: RE | Admit: 2024-06-03 | Discharge: 2024-06-03 | Disposition: A | Discharge status: Home / Self Care | Source: Ambulatory Visit

## 2024-06-03 VITALS — BP 152/65 | HR 57 | Temp 98.2°F | Resp 16 | Ht 67.5 in | Wt 203.5 lb

## 2024-06-03 DIAGNOSIS — I350 Nonrheumatic aortic (valve) stenosis: Secondary | ICD-10-CM | POA: Insufficient documentation

## 2024-06-03 DIAGNOSIS — I712 Thoracic aortic aneurysm, without rupture, unspecified: Secondary | ICD-10-CM | POA: Insufficient documentation

## 2024-06-03 DIAGNOSIS — I351 Nonrheumatic aortic (valve) insufficiency: Secondary | ICD-10-CM | POA: Insufficient documentation

## 2024-06-03 DIAGNOSIS — Z01818 Encounter for other preprocedural examination: Secondary | ICD-10-CM | POA: Insufficient documentation

## 2024-06-03 HISTORY — DX: Nonrheumatic aortic (valve) stenosis: I35.0

## 2024-06-03 HISTORY — DX: Essential (primary) hypertension: I10

## 2024-06-03 HISTORY — DX: Other complications of anesthesia, initial encounter: T88.59XA

## 2024-06-03 HISTORY — DX: Other specified postprocedural states: R11.2

## 2024-06-03 HISTORY — DX: Other specified postprocedural states: Z98.890

## 2024-06-03 LAB — COMPREHENSIVE METABOLIC PANEL WITH GFR
ALT: 18 U/L (ref 0–44)
AST: 21 U/L (ref 15–41)
Albumin: 4.7 g/dL (ref 3.5–5.0)
Alkaline Phosphatase: 87 U/L (ref 38–126)
Anion gap: 12 (ref 5–15)
BUN: 17 mg/dL (ref 8–23)
CO2: 25 mmol/L (ref 22–32)
Calcium: 10 mg/dL (ref 8.9–10.3)
Chloride: 104 mmol/L (ref 98–111)
Creatinine, Ser: 1.15 mg/dL — ABNORMAL HIGH (ref 0.44–1.00)
GFR, Estimated: 52 mL/min — ABNORMAL LOW
Glucose, Bld: 92 mg/dL (ref 70–99)
Potassium: 4 mmol/L (ref 3.5–5.1)
Sodium: 141 mmol/L (ref 135–145)
Total Bilirubin: 0.7 mg/dL (ref 0.0–1.2)
Total Protein: 7.7 g/dL (ref 6.5–8.1)

## 2024-06-03 LAB — URINALYSIS, ROUTINE W REFLEX MICROSCOPIC
Bacteria, UA: NONE SEEN
Bilirubin Urine: NEGATIVE
Glucose, UA: NEGATIVE mg/dL
Ketones, ur: NEGATIVE mg/dL
Nitrite: NEGATIVE
Protein, ur: NEGATIVE mg/dL
Specific Gravity, Urine: 1.005 (ref 1.005–1.030)
pH: 5 (ref 5.0–8.0)

## 2024-06-03 LAB — CBC
HCT: 48.5 % — ABNORMAL HIGH (ref 36.0–46.0)
Hemoglobin: 17 g/dL — ABNORMAL HIGH (ref 12.0–15.0)
MCH: 32.2 pg (ref 26.0–34.0)
MCHC: 35.1 g/dL (ref 30.0–36.0)
MCV: 91.9 fL (ref 80.0–100.0)
Platelets: 256 K/uL (ref 150–400)
RBC: 5.28 MIL/uL — ABNORMAL HIGH (ref 3.87–5.11)
RDW: 12.1 % (ref 11.5–15.5)
WBC: 8 K/uL (ref 4.0–10.5)
nRBC: 0 % (ref 0.0–0.2)

## 2024-06-03 LAB — APTT: aPTT: 31 s (ref 24–36)

## 2024-06-03 LAB — HEMOGLOBIN A1C
Hgb A1c MFr Bld: 5.4 % (ref 4.8–5.6)
Mean Plasma Glucose: 108.28 mg/dL

## 2024-06-03 LAB — PROTIME-INR
INR: 1 (ref 0.8–1.2)
Prothrombin Time: 13.5 s (ref 11.4–15.2)

## 2024-06-03 LAB — SURGICAL PCR SCREEN
MRSA, PCR: NEGATIVE
Staphylococcus aureus: NEGATIVE

## 2024-06-03 NOTE — Progress Notes (Signed)
 PCP - Dr. Norleen Hurst Cardiologist - denies  PPM/ICD - denies Device Orders - na Rep Notified - na  Chest x-ray - PAT, 06/03/2024 EKG - PAT, 06/03/2024 Stress Test - denies ECHO - 04/27/2024 Cardiac Cath - 04/27/2024 Dopplers: 06/03/2024  Sleep Study - denies CPAP - na  Non-diabetic  Blood Thinner Instructions: denies Aspirin  Instructions:denies  ERAS Protcol -NPO  Anesthesia review: Yes. Vascular surgery, HTN, CHF, aortic stenosis  Patient denies shortness of breath, fever, cough and chest pain at PAT appointment   All instructions explained to the patient, with a verbal understanding of the material. Patient agrees to go over the instructions while at home for a better understanding. Patient also instructed to self quarantine after being tested for COVID-19. The opportunity to ask questions was provided.

## 2024-06-03 NOTE — Progress Notes (Signed)
 Carotid artery doppler has been completed.  Results can be found in chart review under CV Proc.  06/03/2024 2:24 PM  Nida Manfredi Elden Appl, RVT.

## 2024-06-04 ENCOUNTER — Ambulatory Visit

## 2024-06-04 ENCOUNTER — Encounter (HOSPITAL_COMMUNITY): Payer: Self-pay

## 2024-06-04 VITALS — BP 158/80 | HR 72 | Resp 20 | Ht 67.5 in | Wt 206.3 lb

## 2024-06-04 DIAGNOSIS — I350 Nonrheumatic aortic (valve) stenosis: Secondary | ICD-10-CM | POA: Diagnosis not present

## 2024-06-04 DIAGNOSIS — I712 Thoracic aortic aneurysm, without rupture, unspecified: Secondary | ICD-10-CM

## 2024-06-04 MED ORDER — INSULIN REGULAR(HUMAN) IN NACL 100-0.9 UT/100ML-% IV SOLN
INTRAVENOUS | Status: AC
  Administered 2024-06-05: .9 [IU]/h via INTRAVENOUS
  Filled 2024-06-04: qty 100

## 2024-06-04 MED ORDER — VANCOMYCIN HCL 1.5 G IV SOLR
1500.0000 mg | INTRAVENOUS | Status: AC
  Administered 2024-06-05: 1500 mg via INTRAVENOUS
  Filled 2024-06-04: qty 30

## 2024-06-04 MED ORDER — NITROGLYCERIN IN D5W 200-5 MCG/ML-% IV SOLN
2.0000 ug/min | INTRAVENOUS | Status: DC
  Filled 2024-06-04: qty 250

## 2024-06-04 MED ORDER — PLASMA-LYTE A IV SOLN
INTRAVENOUS | Status: DC
  Filled 2024-06-04: qty 2.5

## 2024-06-04 MED ORDER — POTASSIUM CHLORIDE 2 MEQ/ML IV SOLN
80.0000 meq | INTRAVENOUS | Status: DC
  Filled 2024-06-04: qty 40

## 2024-06-04 MED ORDER — MANNITOL 20 % IV SOLN
INTRAVENOUS | Status: DC
  Filled 2024-06-04: qty 13

## 2024-06-04 MED ORDER — TRANEXAMIC ACID 1000 MG/10ML IV SOLN
1.5000 mg/kg/h | INTRAVENOUS | Status: AC
  Administered 2024-06-05: 1.5 mg/kg/h via INTRAVENOUS
  Filled 2024-06-04: qty 25

## 2024-06-04 MED ORDER — EPINEPHRINE HCL 5 MG/250ML IV SOLN IN NS
0.0000 ug/min | INTRAVENOUS | Status: AC
  Administered 2024-06-05: 2 ug/min via INTRAVENOUS
  Filled 2024-06-04: qty 250

## 2024-06-04 MED ORDER — DEXMEDETOMIDINE HCL IN NACL 400 MCG/100ML IV SOLN
0.1000 ug/kg/h | INTRAVENOUS | Status: AC
  Administered 2024-06-05: .7 ug/kg/h via INTRAVENOUS
  Filled 2024-06-04: qty 100

## 2024-06-04 MED ORDER — TRANEXAMIC ACID (OHS) PUMP PRIME SOLUTION
2.0000 mg/kg | INTRAVENOUS | Status: DC
  Filled 2024-06-04: qty 1.85

## 2024-06-04 MED ORDER — HEPARIN 30,000 UNITS/1000 ML (OHS) CELLSAVER SOLUTION
Status: DC
  Filled 2024-06-04: qty 1000

## 2024-06-04 MED ORDER — PHENYLEPHRINE HCL-NACL 20-0.9 MG/250ML-% IV SOLN
30.0000 ug/min | INTRAVENOUS | Status: DC
  Filled 2024-06-04: qty 250

## 2024-06-04 MED ORDER — MILRINONE LACTATE IN DEXTROSE 20-5 MG/100ML-% IV SOLN
0.3000 ug/kg/min | INTRAVENOUS | Status: DC
  Filled 2024-06-04: qty 100

## 2024-06-04 MED ORDER — CEFAZOLIN SODIUM-DEXTROSE 2-4 GM/100ML-% IV SOLN
2.0000 g | INTRAVENOUS | Status: DC
  Filled 2024-06-04: qty 100

## 2024-06-04 MED ORDER — CEFAZOLIN SODIUM-DEXTROSE 2-4 GM/100ML-% IV SOLN
2.0000 g | INTRAVENOUS | Status: AC
  Administered 2024-06-05 (×2): 2 g via INTRAVENOUS
  Filled 2024-06-04: qty 100

## 2024-06-04 MED ORDER — TRANEXAMIC ACID (OHS) BOLUS VIA INFUSION
15.0000 mg/kg | INTRAVENOUS | Status: AC
  Administered 2024-06-05: 1384.5 mg via INTRAVENOUS
  Filled 2024-06-04: qty 1385

## 2024-06-04 MED ORDER — NOREPINEPHRINE 4 MG/250ML-% IV SOLN
0.0000 ug/min | INTRAVENOUS | Status: AC
  Administered 2024-06-05: 2 ug/min via INTRAVENOUS
  Filled 2024-06-04: qty 250

## 2024-06-04 NOTE — Patient Instructions (Signed)
 Bring a bra with you to the hospital on the day of surgery.  Look for something that is soft and comfortable that provides some support but is not constricting.  Below is an example of a bra that is available on [employer].

## 2024-06-04 NOTE — Progress Notes (Signed)
 "     [address] [place] [address]             [phone]       Jane Conley is a 69 y.o. female who presents for surgical evaluation of severe AS, mild-moderate AI and an ascending aortic aneurysm in the setting of a bicuspid aortic valve.  We first met on 05/07/24, at which point the patient was reluctant to pursue surgery.  We discussed her case again at TAVR conference, and the major concern was that given her large annulus, she was at high risk for PVL, and the recommendation was for SAVR.  The patient agreed to proceed with surgery and presents today for further discussion before surgery tomorrow.  Since our last visit, she has been doing well. No new chest pain or hospital visits.  Her carotid ultrasounds demonstrated no significant carotid disease.  Allergies as of 06/04/2024       Reactions   Codeine Nausea And Vomiting, Swelling        Medication List        Accurate as of June 04, 2024  9:44 AM. If you have any questions, ask your nurse or doctor.          albuterol 108 (90 Base) MCG/ACT inhaler Commonly known as: VENTOLIN HFA Inhale 2 puffs into the lungs every 6 (six) hours as needed for shortness of breath or wheezing.   B-12 PO Take 1 tablet by mouth daily.   carvedilol  3.125 MG tablet Commonly known as: COREG  Take 1 tablet (3.125 mg total) by mouth 2 (two) times daily with a meal.   cholecalciferol 10 MCG (400 UNIT) Tabs tablet Commonly known as: VITAMIN D3 Take 400 Units by mouth in the morning.   Co Q 10 100 MG Caps Take 100 mg by mouth in the morning.   cyanocobalamin 1000 MCG tablet Take 1,000 mcg by mouth in the morning.   empagliflozin  10 MG Tabs tablet Commonly known as: JARDIANCE  Take 1 tablet (10 mg total) by mouth daily.   furosemide  20 MG tablet Commonly known as: LASIX  Take 1 tablet (20 mg total) by mouth daily.   ibuprofen 200 MG tablet Commonly known as: ADVIL Take 600 mg by mouth every 6 (six)  hours as needed for mild pain (pain score 1-3) or headache.   lisinopril  10 MG tablet Commonly known as: ZESTRIL  Take 1 tablet (10 mg total) by mouth daily.   MAGNESIUM  GLYCINATE ADVANCED PO Take 200 mg by mouth 2 (two) times daily after a meal.   Olopatadine HCl 0.2 % Soln Place 1 drop into both eyes daily as needed (allergies, eye irritations).   vitamin C 1000 MG tablet Take 1,000 mg by mouth daily.   zinc gluconate 50 MG tablet Take 50 mg by mouth in the morning.        BP (!) 158/80 (BP Location: Right Arm, Patient Position: Sitting, Cuff Size: Normal)   Pulse 72   Resp 20   Ht 5' 7.5 (1.715 m)   Wt 206 lb 4.8 oz (93.6 kg)   SpO2 95% Comment: RA  BMI 31.83 kg/m    Assessment/Plan: Jane Conley is a pleasant 69 year old woman with no significant past medical history, who was recently admitted with acute decompensated heart failure in the setting of severe aortic stenosis and reduced EF 40-45%. She was found to have a bicuspid valve with enlarged thoracic ascending aortic aneurysm. Her root  and STJ are not dilated, but her RCA is high and the aorta dilates immediately distal to the STJ.  The plan will be for wheat/hemiarch with hypothermic circulatory arrest and possible Bentall/root replacement depending on the size of the aorta at the proximal anastomosis.  I discussed the general nature of the procedure, including the need for general anesthesia, the incisions to be used, the use of cardiopulmonary bypass, and the use of temporary pacemaker wires and drainage tubes postoperatively with Ms. Yott, her daughter and her husband.  We discussed the expected hospital stay, overall recovery and short and long term outcomes. I informed them of the indications, risks, benefits and alternatives.   They understand the risks include, but are not limited to death, stroke, MI, DVT/PE, bleeding, possible need for transfusion, infections, cardiac arrhythmias, as well as other organ system  dysfunction including respiratory (eg: prolonged ventilation), renal, or GI complications.   The pros and cons of a biological vs mechanical valve were discussed.  After joint discussion between the patient and myself, we have decided on implantation of a biologic valve  Patient is in agreement to proceed with surgery.  Plan: Proceed with AVR, replacement of ascending and hemiarch aorta with HCA and retrograde cerebral perfusion, possible Bentall to be decided intra-op.  Will need just one radial arterial line and continuous cardiac output swan monitoring.   Con GORMAN Clunes, MD 9:44 AM 06/04/2024  "

## 2024-06-05 ENCOUNTER — Inpatient Hospital Stay (HOSPITAL_COMMUNITY): Admitting: Certified Registered Nurse Anesthetist

## 2024-06-05 ENCOUNTER — Inpatient Hospital Stay (HOSPITAL_COMMUNITY)

## 2024-06-05 ENCOUNTER — Inpatient Hospital Stay (HOSPITAL_COMMUNITY): Admission: RE | Disposition: A | Payer: Self-pay | Source: Home / Self Care

## 2024-06-05 ENCOUNTER — Inpatient Hospital Stay (HOSPITAL_COMMUNITY)
Admission: RE | Admit: 2024-06-05 | Discharge: 2024-06-10 | DRG: 220 | Disposition: A | Discharge status: Home / Self Care

## 2024-06-05 DIAGNOSIS — I712 Thoracic aortic aneurysm, without rupture, unspecified: Secondary | ICD-10-CM | POA: Diagnosis not present

## 2024-06-05 DIAGNOSIS — Z7982 Long term (current) use of aspirin: Secondary | ICD-10-CM

## 2024-06-05 DIAGNOSIS — E1165 Type 2 diabetes mellitus with hyperglycemia: Secondary | ICD-10-CM

## 2024-06-05 DIAGNOSIS — I352 Nonrheumatic aortic (valve) stenosis with insufficiency: Secondary | ICD-10-CM | POA: Diagnosis present

## 2024-06-05 DIAGNOSIS — Z794 Long term (current) use of insulin: Secondary | ICD-10-CM

## 2024-06-05 DIAGNOSIS — R739 Hyperglycemia, unspecified: Secondary | ICD-10-CM | POA: Diagnosis not present

## 2024-06-05 DIAGNOSIS — Q2381 Bicuspid aortic valve: Secondary | ICD-10-CM | POA: Diagnosis not present

## 2024-06-05 DIAGNOSIS — Z952 Presence of prosthetic heart valve: Principal | ICD-10-CM

## 2024-06-05 DIAGNOSIS — Z7984 Long term (current) use of oral hypoglycemic drugs: Secondary | ICD-10-CM

## 2024-06-05 DIAGNOSIS — Z95828 Presence of other vascular implants and grafts: Secondary | ICD-10-CM | POA: Diagnosis not present

## 2024-06-05 DIAGNOSIS — E669 Obesity, unspecified: Secondary | ICD-10-CM | POA: Diagnosis present

## 2024-06-05 DIAGNOSIS — Z90710 Acquired absence of both cervix and uterus: Secondary | ICD-10-CM

## 2024-06-05 DIAGNOSIS — Z6833 Body mass index (BMI) 33.0-33.9, adult: Secondary | ICD-10-CM | POA: Diagnosis not present

## 2024-06-05 DIAGNOSIS — I350 Nonrheumatic aortic (valve) stenosis: Secondary | ICD-10-CM | POA: Diagnosis present

## 2024-06-05 DIAGNOSIS — I493 Ventricular premature depolarization: Secondary | ICD-10-CM | POA: Diagnosis not present

## 2024-06-05 DIAGNOSIS — D6959 Other secondary thrombocytopenia: Secondary | ICD-10-CM | POA: Diagnosis not present

## 2024-06-05 DIAGNOSIS — Z885 Allergy status to narcotic agent status: Secondary | ICD-10-CM

## 2024-06-05 DIAGNOSIS — J90 Pleural effusion, not elsewhere classified: Secondary | ICD-10-CM | POA: Diagnosis not present

## 2024-06-05 DIAGNOSIS — I7121 Aneurysm of the ascending aorta, without rupture: Secondary | ICD-10-CM | POA: Diagnosis present

## 2024-06-05 DIAGNOSIS — D689 Coagulation defect, unspecified: Secondary | ICD-10-CM

## 2024-06-05 DIAGNOSIS — D72828 Other elevated white blood cell count: Secondary | ICD-10-CM | POA: Diagnosis not present

## 2024-06-05 DIAGNOSIS — D696 Thrombocytopenia, unspecified: Secondary | ICD-10-CM | POA: Diagnosis not present

## 2024-06-05 DIAGNOSIS — I351 Nonrheumatic aortic (valve) insufficiency: Secondary | ICD-10-CM

## 2024-06-05 DIAGNOSIS — K5900 Constipation, unspecified: Secondary | ICD-10-CM | POA: Diagnosis not present

## 2024-06-05 DIAGNOSIS — I110 Hypertensive heart disease with heart failure: Secondary | ICD-10-CM | POA: Diagnosis present

## 2024-06-05 DIAGNOSIS — E66811 Obesity, class 1: Secondary | ICD-10-CM | POA: Diagnosis not present

## 2024-06-05 DIAGNOSIS — I10 Essential (primary) hypertension: Secondary | ICD-10-CM | POA: Diagnosis not present

## 2024-06-05 DIAGNOSIS — Z79899 Other long term (current) drug therapy: Secondary | ICD-10-CM | POA: Diagnosis not present

## 2024-06-05 DIAGNOSIS — Z01818 Encounter for other preprocedural examination: Secondary | ICD-10-CM

## 2024-06-05 DIAGNOSIS — I5030 Unspecified diastolic (congestive) heart failure: Secondary | ICD-10-CM | POA: Diagnosis present

## 2024-06-05 HISTORY — PX: REPLACEMENT ASCENDING AORTA: SHX6068

## 2024-06-05 HISTORY — PX: AORTIC VALVE REPLACEMENT: SHX41

## 2024-06-05 HISTORY — PX: INTRAOPERATIVE TRANSESOPHAGEAL ECHOCARDIOGRAM: SHX5062

## 2024-06-05 LAB — ECHO INTRAOPERATIVE TEE
AV Mean grad: 15 mmHg
AV Peak grad: 18.8 mmHg
Ao pk vel: 2.17 m/s
Height: 67.5 in
P 1/2 time: 409 ms
S' Lateral: 2.95 cm
Weight: 3300.8 [oz_av]

## 2024-06-05 LAB — POCT I-STAT, CHEM 8
BUN: 13 mg/dL (ref 8–23)
BUN: 11 mg/dL (ref 8–23)
BUN: 13 mg/dL (ref 8–23)
BUN: 11 mg/dL (ref 8–23)
BUN: 10 mg/dL (ref 8–23)
BUN: 9 mg/dL (ref 8–23)
Calcium, Ion: 1.27 mmol/L (ref 1.15–1.40)
Calcium, Ion: 1.19 mmol/L (ref 1.15–1.40)
Calcium, Ion: 1.02 mmol/L — ABNORMAL LOW (ref 1.15–1.40)
Calcium, Ion: 0.89 mmol/L — CL (ref 1.15–1.40)
Calcium, Ion: 1.12 mmol/L — ABNORMAL LOW (ref 1.15–1.40)
Calcium, Ion: 1.03 mmol/L — ABNORMAL LOW (ref 1.15–1.40)
Chloride: 104 mmol/L (ref 98–111)
Chloride: 104 mmol/L (ref 98–111)
Chloride: 101 mmol/L (ref 98–111)
Chloride: 102 mmol/L (ref 98–111)
Chloride: 103 mmol/L (ref 98–111)
Chloride: 106 mmol/L (ref 98–111)
Creatinine, Ser: 0.8 mg/dL (ref 0.44–1.00)
Creatinine, Ser: 0.9 mg/dL (ref 0.44–1.00)
Creatinine, Ser: 0.9 mg/dL (ref 0.44–1.00)
Creatinine, Ser: 0.7 mg/dL (ref 0.44–1.00)
Creatinine, Ser: 0.8 mg/dL (ref 0.44–1.00)
Creatinine, Ser: 0.8 mg/dL (ref 0.44–1.00)
Glucose, Bld: 110 mg/dL — ABNORMAL HIGH (ref 70–99)
Glucose, Bld: 143 mg/dL — ABNORMAL HIGH (ref 70–99)
Glucose, Bld: 210 mg/dL — ABNORMAL HIGH (ref 70–99)
Glucose, Bld: 173 mg/dL — ABNORMAL HIGH (ref 70–99)
Glucose, Bld: 182 mg/dL — ABNORMAL HIGH (ref 70–99)
Glucose, Bld: 114 mg/dL — ABNORMAL HIGH (ref 70–99)
HCT: 40 % (ref 36.0–46.0)
HCT: 35 % — ABNORMAL LOW (ref 36.0–46.0)
HCT: 30 % — ABNORMAL LOW (ref 36.0–46.0)
HCT: 26 % — ABNORMAL LOW (ref 36.0–46.0)
HCT: 27 % — ABNORMAL LOW (ref 36.0–46.0)
HCT: 28 % — ABNORMAL LOW (ref 36.0–46.0)
Hemoglobin: 13.6 g/dL (ref 12.0–15.0)
Hemoglobin: 11.9 g/dL — ABNORMAL LOW (ref 12.0–15.0)
Hemoglobin: 10.2 g/dL — ABNORMAL LOW (ref 12.0–15.0)
Hemoglobin: 8.8 g/dL — ABNORMAL LOW (ref 12.0–15.0)
Hemoglobin: 9.2 g/dL — ABNORMAL LOW (ref 12.0–15.0)
Hemoglobin: 9.5 g/dL — ABNORMAL LOW (ref 12.0–15.0)
Potassium: 3.4 mmol/L — ABNORMAL LOW (ref 3.5–5.1)
Potassium: 3.3 mmol/L — ABNORMAL LOW (ref 3.5–5.1)
Potassium: 4 mmol/L (ref 3.5–5.1)
Potassium: 3.8 mmol/L (ref 3.5–5.1)
Potassium: 3.6 mmol/L (ref 3.5–5.1)
Potassium: 3.3 mmol/L — ABNORMAL LOW (ref 3.5–5.1)
Sodium: 145 mmol/L (ref 135–145)
Sodium: 144 mmol/L (ref 135–145)
Sodium: 137 mmol/L (ref 135–145)
Sodium: 140 mmol/L (ref 135–145)
Sodium: 141 mmol/L (ref 135–145)
Sodium: 145 mmol/L (ref 135–145)
TCO2: 23 mmol/L (ref 22–32)
TCO2: 21 mmol/L — ABNORMAL LOW (ref 22–32)
TCO2: 24 mmol/L (ref 22–32)
TCO2: 24 mmol/L (ref 22–32)
TCO2: 21 mmol/L — ABNORMAL LOW (ref 22–32)
TCO2: 23 mmol/L (ref 22–32)

## 2024-06-05 LAB — POCT I-STAT 7, (LYTES, BLD GAS, ICA,H+H)
Acid-Base Excess: 0 mmol/L (ref 0.0–2.0)
Acid-base deficit: 1 mmol/L (ref 0.0–2.0)
Acid-base deficit: 2 mmol/L (ref 0.0–2.0)
Acid-base deficit: 4 mmol/L — ABNORMAL HIGH (ref 0.0–2.0)
Acid-base deficit: 3 mmol/L — ABNORMAL HIGH (ref 0.0–2.0)
Bicarbonate: 24.1 mmol/L (ref 20.0–28.0)
Bicarbonate: 22.8 mmol/L (ref 20.0–28.0)
Bicarbonate: 24.6 mmol/L (ref 20.0–28.0)
Bicarbonate: 20.6 mmol/L (ref 20.0–28.0)
Bicarbonate: 22.9 mmol/L (ref 20.0–28.0)
Calcium, Ion: 1.02 mmol/L — ABNORMAL LOW (ref 1.15–1.40)
Calcium, Ion: 1.03 mmol/L — ABNORMAL LOW (ref 1.15–1.40)
Calcium, Ion: 0.97 mmol/L — ABNORMAL LOW (ref 1.15–1.40)
Calcium, Ion: 1.13 mmol/L — ABNORMAL LOW (ref 1.15–1.40)
Calcium, Ion: 1.12 mmol/L — ABNORMAL LOW (ref 1.15–1.40)
HCT: 27 % — ABNORMAL LOW (ref 36.0–46.0)
HCT: 28 % — ABNORMAL LOW (ref 36.0–46.0)
HCT: 28 % — ABNORMAL LOW (ref 36.0–46.0)
HCT: 25 % — ABNORMAL LOW (ref 36.0–46.0)
HCT: 32 % — ABNORMAL LOW (ref 36.0–46.0)
Hemoglobin: 9.2 g/dL — ABNORMAL LOW (ref 12.0–15.0)
Hemoglobin: 9.5 g/dL — ABNORMAL LOW (ref 12.0–15.0)
Hemoglobin: 9.5 g/dL — ABNORMAL LOW (ref 12.0–15.0)
Hemoglobin: 8.5 g/dL — ABNORMAL LOW (ref 12.0–15.0)
Hemoglobin: 10.9 g/dL — ABNORMAL LOW (ref 12.0–15.0)
O2 Saturation: 100 %
O2 Saturation: 100 %
O2 Saturation: 100 %
O2 Saturation: 100 %
O2 Saturation: 97 %
Patient temperature: 36.1
Potassium: 3.8 mmol/L (ref 3.5–5.1)
Potassium: 4 mmol/L (ref 3.5–5.1)
Potassium: 4.3 mmol/L (ref 3.5–5.1)
Potassium: 3.6 mmol/L (ref 3.5–5.1)
Potassium: 3.6 mmol/L (ref 3.5–5.1)
Sodium: 140 mmol/L (ref 135–145)
Sodium: 135 mmol/L (ref 135–145)
Sodium: 139 mmol/L (ref 135–145)
Sodium: 140 mmol/L (ref 135–145)
Sodium: 144 mmol/L (ref 135–145)
TCO2: 25 mmol/L (ref 22–32)
TCO2: 24 mmol/L (ref 22–32)
TCO2: 26 mmol/L (ref 22–32)
TCO2: 22 mmol/L (ref 22–32)
TCO2: 24 mmol/L (ref 22–32)
pCO2 arterial: 40.1 mmHg (ref 32–48)
pCO2 arterial: 39.9 mmHg (ref 32–48)
pCO2 arterial: 38.6 mmHg (ref 32–48)
pCO2 arterial: 35.3 mmHg (ref 32–48)
pCO2 arterial: 44.3 mmHg (ref 32–48)
pH, Arterial: 7.386 (ref 7.35–7.45)
pH, Arterial: 7.365 (ref 7.35–7.45)
pH, Arterial: 7.411 (ref 7.35–7.45)
pH, Arterial: 7.374 (ref 7.35–7.45)
pH, Arterial: 7.317 — ABNORMAL LOW (ref 7.35–7.45)
pO2, Arterial: 352 mmHg — ABNORMAL HIGH (ref 83–108)
pO2, Arterial: 381 mmHg — ABNORMAL HIGH (ref 83–108)
pO2, Arterial: 294 mmHg — ABNORMAL HIGH (ref 83–108)
pO2, Arterial: 185 mmHg — ABNORMAL HIGH (ref 83–108)
pO2, Arterial: 96 mmHg (ref 83–108)

## 2024-06-05 LAB — POCT I-STAT EG7
Acid-Base Excess: 0 mmol/L (ref 0.0–2.0)
Acid-Base Excess: 0 mmol/L (ref 0.0–2.0)
Bicarbonate: 25.3 mmol/L (ref 20.0–28.0)
Bicarbonate: 25.5 mmol/L (ref 20.0–28.0)
Calcium, Ion: 1.25 mmol/L (ref 1.15–1.40)
Calcium, Ion: 1.06 mmol/L — ABNORMAL LOW (ref 1.15–1.40)
HCT: 42 % (ref 36.0–46.0)
HCT: 27 % — ABNORMAL LOW (ref 36.0–46.0)
Hemoglobin: 14.3 g/dL (ref 12.0–15.0)
Hemoglobin: 9.2 g/dL — ABNORMAL LOW (ref 12.0–15.0)
O2 Saturation: 66 %
O2 Saturation: 67 %
Potassium: 3.8 mmol/L (ref 3.5–5.1)
Potassium: 3.5 mmol/L (ref 3.5–5.1)
Sodium: 142 mmol/L (ref 135–145)
Sodium: 141 mmol/L (ref 135–145)
TCO2: 27 mmol/L (ref 22–32)
TCO2: 27 mmol/L (ref 22–32)
pCO2, Ven: 44.1 mmHg (ref 44–60)
pCO2, Ven: 46.9 mmHg (ref 44–60)
pH, Ven: 7.367 (ref 7.25–7.43)
pH, Ven: 7.343 (ref 7.25–7.43)
pO2, Ven: 36 mmHg (ref 32–45)
pO2, Ven: 37 mmHg (ref 32–45)

## 2024-06-05 LAB — COOXEMETRY PANEL
Carboxyhemoglobin: 1.6 % — ABNORMAL HIGH (ref 0.5–1.5)
Methemoglobin: <0.7 % (ref 0.0–1.5)
O2 Saturation: 64.7 %
Total hemoglobin: 11.2 g/dL — ABNORMAL LOW (ref 12.0–16.0)

## 2024-06-05 LAB — CBC
HCT: 31.7 % — ABNORMAL LOW (ref 36.0–46.0)
HCT: 29.8 % — ABNORMAL LOW (ref 36.0–46.0)
Hemoglobin: 11 g/dL — ABNORMAL LOW (ref 12.0–15.0)
Hemoglobin: 10.4 g/dL — ABNORMAL LOW (ref 12.0–15.0)
MCH: 32.6 pg (ref 26.0–34.0)
MCH: 32.4 pg (ref 26.0–34.0)
MCHC: 34.7 g/dL (ref 30.0–36.0)
MCHC: 34.9 g/dL (ref 30.0–36.0)
MCV: 94.1 fL (ref 80.0–100.0)
MCV: 92.8 fL (ref 80.0–100.0)
Platelets: 140 K/uL — ABNORMAL LOW (ref 150–400)
Platelets: 104 K/uL — ABNORMAL LOW (ref 150–400)
RBC: 3.37 MIL/uL — ABNORMAL LOW (ref 3.87–5.11)
RBC: 3.21 MIL/uL — ABNORMAL LOW (ref 3.87–5.11)
RDW: 12.1 % (ref 11.5–15.5)
RDW: 12.4 % (ref 11.5–15.5)
WBC: 26.2 K/uL — ABNORMAL HIGH (ref 4.0–10.5)
WBC: 11.3 K/uL — ABNORMAL HIGH (ref 4.0–10.5)
nRBC: 0 % (ref 0.0–0.2)
nRBC: 0 % (ref 0.0–0.2)

## 2024-06-05 LAB — BASIC METABOLIC PANEL WITH GFR
Anion gap: 10 (ref 5–15)
Anion gap: 9 (ref 5–15)
BUN: 10 mg/dL (ref 8–23)
BUN: 11 mg/dL (ref 8–23)
CO2: 23 mmol/L (ref 22–32)
CO2: 24 mmol/L (ref 22–32)
Calcium: 7.2 mg/dL — ABNORMAL LOW (ref 8.9–10.3)
Calcium: 7.8 mg/dL — ABNORMAL LOW (ref 8.9–10.3)
Chloride: 109 mmol/L (ref 98–111)
Chloride: 108 mmol/L (ref 98–111)
Creatinine, Ser: 0.86 mg/dL (ref 0.44–1.00)
Creatinine, Ser: 0.8 mg/dL (ref 0.44–1.00)
GFR, Estimated: >60 mL/min
GFR, Estimated: >60 mL/min
Glucose, Bld: 133 mg/dL — ABNORMAL HIGH (ref 70–99)
Glucose, Bld: 138 mg/dL — ABNORMAL HIGH (ref 70–99)
Potassium: 3.7 mmol/L (ref 3.5–5.1)
Potassium: 4 mmol/L (ref 3.5–5.1)
Sodium: 142 mmol/L (ref 135–145)
Sodium: 141 mmol/L (ref 135–145)

## 2024-06-05 LAB — FIBRINOGEN
Fibrinogen: 233 mg/dL (ref 210–475)
Fibrinogen: 233 mg/dL (ref 210–475)

## 2024-06-05 LAB — PROTIME-INR
INR: 1.6 — ABNORMAL HIGH (ref 0.8–1.2)
Prothrombin Time: 19.9 s — ABNORMAL HIGH (ref 11.4–15.2)

## 2024-06-05 LAB — HEMOGLOBIN AND HEMATOCRIT, BLOOD
HCT: 27.5 % — ABNORMAL LOW (ref 36.0–46.0)
Hemoglobin: 9.8 g/dL — ABNORMAL LOW (ref 12.0–15.0)

## 2024-06-05 LAB — PREPARE RBC (CROSSMATCH)

## 2024-06-05 LAB — ABO/RH: ABO/RH(D): A POS

## 2024-06-05 LAB — APTT: aPTT: 33 s (ref 24–36)

## 2024-06-05 LAB — PLATELET COUNT: Platelets: 116 K/uL — ABNORMAL LOW (ref 150–400)

## 2024-06-05 MED ORDER — ROCURONIUM BROMIDE 10 MG/ML (PF) SYRINGE
PREFILLED_SYRINGE | INTRAVENOUS | Status: AC
  Filled 2024-06-05: qty 10

## 2024-06-05 MED ORDER — CEFAZOLIN SODIUM-DEXTROSE 2-4 GM/100ML-% IV SOLN
2.0000 g | Freq: Three times a day (TID) | INTRAVENOUS | Status: AC
  Administered 2024-06-05 – 2024-06-07 (×6): 2 g via INTRAVENOUS
  Filled 2024-06-05 (×6): qty 100

## 2024-06-05 MED ORDER — DEXMEDETOMIDINE HCL IN NACL 400 MCG/100ML IV SOLN: 0.0000 ug/kg/h | INTRAVENOUS | Status: DC

## 2024-06-05 MED ORDER — NOREPINEPHRINE 4 MG/250ML-% IV SOLN: 0.0000 ug/min | INTRAVENOUS | Status: DC

## 2024-06-05 MED ORDER — PROPOFOL 10 MG/ML IV BOLUS
INTRAVENOUS | Status: AC
  Filled 2024-06-05: qty 20

## 2024-06-05 MED ORDER — PROPOFOL 10 MG/ML IV BOLUS
INTRAVENOUS | Status: DC | PRN
  Administered 2024-06-05: 200 mg via INTRAVENOUS
  Administered 2024-06-05: 90 mg via INTRAVENOUS
  Administered 2024-06-05: 50 mg via INTRAVENOUS

## 2024-06-05 MED ORDER — METOPROLOL TARTRATE 5 MG/5ML IV SOLN: 2.5000 mg | INTRAVENOUS | Status: DC | PRN

## 2024-06-05 MED ORDER — LACTATED RINGERS IV SOLN: INTRAVENOUS | Status: AC

## 2024-06-05 MED ORDER — SODIUM CHLORIDE 0.9 % IV SOLN: INTRAVENOUS | Status: DC | PRN

## 2024-06-05 MED ORDER — METHYLPREDNISOLONE SODIUM SUCC 125 MG IJ SOLR
INTRAMUSCULAR | Status: AC
  Filled 2024-06-05: qty 2

## 2024-06-05 MED ORDER — FENTANYL CITRATE (PF) 250 MCG/5ML IJ SOLN
INTRAMUSCULAR | Status: AC
  Filled 2024-06-05: qty 5

## 2024-06-05 MED ORDER — ACETAMINOPHEN 160 MG/5ML PO SOLN
650.0000 mg | Freq: Once | ORAL | Status: AC
  Administered 2024-06-05: 650 mg
  Filled 2024-06-05: qty 20.3

## 2024-06-05 MED ORDER — FENTANYL CITRATE (PF) 250 MCG/5ML IJ SOLN
INTRAMUSCULAR | Status: DC | PRN
  Administered 2024-06-05: 50 ug via INTRAVENOUS
  Administered 2024-06-05 (×3): 100 ug via INTRAVENOUS
  Administered 2024-06-05: 250 ug via INTRAVENOUS
  Administered 2024-06-05: 150 ug via INTRAVENOUS
  Administered 2024-06-05: 200 ug via INTRAVENOUS
  Administered 2024-06-05 (×2): 150 ug via INTRAVENOUS

## 2024-06-05 MED ORDER — CLEVIDIPINE BUTYRATE 0.5 MG/ML IV EMUL
0.0000 mg/h | INTRAVENOUS | Status: DC
  Administered 2024-06-05 – 2024-06-06 (×2): 2 mg/h via INTRAVENOUS
  Filled 2024-06-05 (×3): qty 100

## 2024-06-05 MED ORDER — NOREPINEPHRINE BITARTRATE 1 MG/ML IV SOLN
INTRAVENOUS | Status: DC | PRN
  Administered 2024-06-05 (×3): .5 mL via INTRAVENOUS

## 2024-06-05 MED ORDER — PANTOPRAZOLE SODIUM 40 MG IV SOLR
40.0000 mg | Freq: Every day | INTRAVENOUS | Status: AC
  Administered 2024-06-05 – 2024-06-06 (×2): 40 mg via INTRAVENOUS
  Filled 2024-06-05 (×2): qty 10

## 2024-06-05 MED ORDER — METHYLPREDNISOLONE SODIUM SUCC 125 MG IJ SOLR
INTRAMUSCULAR | Status: DC | PRN
  Administered 2024-06-05: 125 mg via INTRAVENOUS

## 2024-06-05 MED ORDER — PROPOFOL 500 MG/50ML IV EMUL
INTRAVENOUS | Status: DC | PRN
  Administered 2024-06-05: 25 ug/kg/min via INTRAVENOUS

## 2024-06-05 MED ORDER — METOCLOPRAMIDE HCL 5 MG/ML IJ SOLN
10.0000 mg | Freq: Four times a day (QID) | INTRAMUSCULAR | Status: AC
  Administered 2024-06-05 – 2024-06-06 (×5): 10 mg via INTRAVENOUS
  Filled 2024-06-05 (×5): qty 2

## 2024-06-05 MED ORDER — CEFAZOLIN SODIUM-DEXTROSE 2-4 GM/100ML-% IV SOLN: 2.0000 g | Freq: Three times a day (TID) | INTRAVENOUS | Status: DC

## 2024-06-05 MED ORDER — ASPIRIN 81 MG PO CHEW
324.0000 mg | CHEWABLE_TABLET | Freq: Once | ORAL | Status: AC
  Administered 2024-06-05: 324 mg via ORAL
  Filled 2024-06-05: qty 4

## 2024-06-05 MED ORDER — CALCIUM GLUCONATE-NACL 1-0.675 GM/50ML-% IV SOLN
1.0000 g | Freq: Once | INTRAVENOUS | Status: AC
  Administered 2024-06-05: 1000 mg via INTRAVENOUS
  Filled 2024-06-05: qty 50

## 2024-06-05 MED ORDER — ROCURONIUM BROMIDE 10 MG/ML (PF) SYRINGE
PREFILLED_SYRINGE | INTRAVENOUS | Status: DC | PRN
  Administered 2024-06-05: 30 mg via INTRAVENOUS
  Administered 2024-06-05: 70 mg via INTRAVENOUS
  Administered 2024-06-05: 30 mg via INTRAVENOUS
  Administered 2024-06-05: 70 mg via INTRAVENOUS
  Administered 2024-06-05: 30 mg via INTRAVENOUS

## 2024-06-05 MED ORDER — ~~LOC~~ CARDIAC SURGERY, PATIENT & FAMILY EDUCATION
Freq: Once | Status: DC
  Filled 2024-06-05: qty 1

## 2024-06-05 MED ORDER — NITROGLYCERIN 0.2 MG/ML ON CALL CATH LAB
INTRAVENOUS | Status: DC | PRN
  Administered 2024-06-05: 20 ug via INTRAVENOUS

## 2024-06-05 MED ORDER — SODIUM CHLORIDE 0.9% FLUSH: 3.0000 mL | INTRAVENOUS | Status: DC | PRN

## 2024-06-05 MED ORDER — LIDOCAINE 2% (20 MG/ML) 5 ML SYRINGE
INTRAMUSCULAR | Status: AC
  Filled 2024-06-05: qty 5

## 2024-06-05 MED ORDER — ALBUMIN HUMAN 5 % IV SOLN: INTRAVENOUS | Status: DC | PRN

## 2024-06-05 MED ORDER — DOCUSATE SODIUM 100 MG PO CAPS
200.0000 mg | ORAL_CAPSULE | Freq: Every day | ORAL | Status: DC
  Administered 2024-06-06: 200 mg via ORAL
  Filled 2024-06-05: qty 2

## 2024-06-05 MED ORDER — SUGAMMADEX SODIUM 200 MG/2ML IV SOLN
INTRAVENOUS | Status: DC | PRN
  Administered 2024-06-05: 200 mg via INTRAVENOUS

## 2024-06-05 MED ORDER — SODIUM CHLORIDE 0.9 % IV SOLN: INTRAVENOUS | Status: AC

## 2024-06-05 MED ORDER — SODIUM CHLORIDE 0.9% FLUSH
3.0000 mL | Freq: Two times a day (BID) | INTRAVENOUS | Status: DC
  Administered 2024-06-06 – 2024-06-09 (×8): 3 mL via INTRAVENOUS

## 2024-06-05 MED ORDER — CLEVIDIPINE BUTYRATE 0.5 MG/ML IV EMUL
INTRAVENOUS | Status: AC
  Filled 2024-06-05: qty 50

## 2024-06-05 MED ORDER — LACTATED RINGERS IV SOLN: INTRAVENOUS | Status: DC | PRN

## 2024-06-05 MED ORDER — CHLORHEXIDINE GLUCONATE 4 % EX SOLN: 30.0000 mL | CUTANEOUS | Status: DC

## 2024-06-05 MED ORDER — ASPIRIN 325 MG PO TBEC
325.0000 mg | DELAYED_RELEASE_TABLET | Freq: Every day | ORAL | Status: DC
  Administered 2024-06-06 – 2024-06-10 (×5): 325 mg via ORAL
  Filled 2024-06-05 (×5): qty 1

## 2024-06-05 MED ORDER — THROMBIN (RECOMBINANT) 20000 UNITS EX SOLR
CUTANEOUS | Status: AC
  Filled 2024-06-05: qty 20000

## 2024-06-05 MED ORDER — ASPIRIN 81 MG PO CHEW: 324.0000 mg | CHEWABLE_TABLET | Freq: Every day | ORAL | Status: DC

## 2024-06-05 MED ORDER — POTASSIUM CHLORIDE 10 MEQ/100ML IV SOLN: 10.0000 meq | INTRAVENOUS | Status: DC

## 2024-06-05 MED ORDER — VASOPRESSIN 20 UNIT/ML IV SOLN
INTRAVENOUS | Status: DC | PRN
  Administered 2024-06-05 (×2): 1 [IU] via INTRAVENOUS
  Administered 2024-06-05: 2 [IU] via INTRAVENOUS
  Administered 2024-06-05: .5 [IU] via INTRAVENOUS
  Administered 2024-06-05: 1 [IU] via INTRAVENOUS
  Administered 2024-06-05: .5 [IU] via INTRAVENOUS

## 2024-06-05 MED ORDER — LACTATED RINGERS IV SOLN: INTRAVENOUS | Status: DC

## 2024-06-05 MED ORDER — CHLORHEXIDINE GLUCONATE 0.12 % MT SOLN
15.0000 mL | OROMUCOSAL | Status: AC
  Administered 2024-06-05: 15 mL via OROMUCOSAL
  Filled 2024-06-05: qty 15

## 2024-06-05 MED ORDER — VANCOMYCIN HCL IN DEXTROSE 1-5 GM/200ML-% IV SOLN
1000.0000 mg | Freq: Once | INTRAVENOUS | Status: AC
  Administered 2024-06-05: 1000 mg via INTRAVENOUS
  Filled 2024-06-05: qty 200

## 2024-06-05 MED ORDER — ORAL CARE MOUTH RINSE: 15.0000 mL | Freq: Once | OROMUCOSAL | Status: AC

## 2024-06-05 MED ORDER — BISACODYL 10 MG RE SUPP: 10.0000 mg | Freq: Every day | RECTAL | Status: DC

## 2024-06-05 MED ORDER — INSULIN REGULAR(HUMAN) IN NACL 100-0.9 UT/100ML-% IV SOLN
INTRAVENOUS | Status: DC
  Filled 2024-06-05: qty 100

## 2024-06-05 MED ORDER — 0.9 % SODIUM CHLORIDE (POUR BTL) OPTIME
TOPICAL | Status: DC | PRN
  Administered 2024-06-05: 1000 mL
  Administered 2024-06-05: 6000 mL

## 2024-06-05 MED ORDER — CHLORHEXIDINE GLUCONATE 0.12 % MT SOLN
15.0000 mL | Freq: Once | OROMUCOSAL | Status: AC
  Administered 2024-06-05: 15 mL via OROMUCOSAL
  Filled 2024-06-05: qty 15

## 2024-06-05 MED ORDER — SODIUM CHLORIDE 0.9 % IV SOLN
20.0000 ug | Freq: Once | INTRAVENOUS | Status: AC
  Administered 2024-06-05: 20 ug via INTRAVENOUS
  Filled 2024-06-05: qty 5

## 2024-06-05 MED ORDER — ONDANSETRON HCL 4 MG/2ML IJ SOLN
4.0000 mg | Freq: Four times a day (QID) | INTRAMUSCULAR | Status: DC | PRN
  Administered 2024-06-05 – 2024-06-07 (×4): 4 mg via INTRAVENOUS
  Filled 2024-06-05 (×4): qty 2

## 2024-06-05 MED ORDER — HEPARIN SODIUM (PORCINE) 1000 UNIT/ML IJ SOLN
INTRAMUSCULAR | Status: DC | PRN
  Administered 2024-06-05: 31000 [IU] via INTRAVENOUS

## 2024-06-05 MED ORDER — BISACODYL 5 MG PO TBEC
10.0000 mg | DELAYED_RELEASE_TABLET | Freq: Every day | ORAL | Status: DC
  Administered 2024-06-06: 10 mg via ORAL
  Filled 2024-06-05: qty 2

## 2024-06-05 MED ORDER — PANTOPRAZOLE SODIUM 40 MG PO TBEC
40.0000 mg | DELAYED_RELEASE_TABLET | Freq: Every day | ORAL | Status: DC
  Administered 2024-06-07 – 2024-06-08 (×2): 40 mg via ORAL
  Filled 2024-06-05 (×2): qty 1

## 2024-06-05 MED ORDER — ALBUMIN HUMAN 5 % IV SOLN
250.0000 mL | INTRAVENOUS | Status: DC | PRN
  Administered 2024-06-05 (×2): 12.5 g via INTRAVENOUS

## 2024-06-05 MED ORDER — MAGNESIUM SULFATE 4 GM/100ML IV SOLN
4.0000 g | Freq: Once | INTRAVENOUS | Status: AC
  Administered 2024-06-05: 4 g via INTRAVENOUS

## 2024-06-05 MED ORDER — MIDAZOLAM HCL (PF) 10 MG/2ML IJ SOLN
INTRAMUSCULAR | Status: AC
  Filled 2024-06-05: qty 2

## 2024-06-05 MED ORDER — VASOPRESSIN 20 UNIT/ML IV SOLN
INTRAVENOUS | Status: AC
  Filled 2024-06-05: qty 1

## 2024-06-05 MED ORDER — PROTAMINE SULFATE 10 MG/ML IV SOLN
INTRAVENOUS | Status: DC | PRN
  Administered 2024-06-05: 300 mg via INTRAVENOUS
  Administered 2024-06-05: 25 mg via INTRAVENOUS
  Administered 2024-06-05: 50 mg via INTRAVENOUS

## 2024-06-05 MED ORDER — OXYCODONE HCL 5 MG PO TABS
5.0000 mg | ORAL_TABLET | ORAL | Status: DC | PRN
  Administered 2024-06-06: 5 mg via ORAL
  Filled 2024-06-05 (×2): qty 1

## 2024-06-05 MED ORDER — THROMBIN (RECOMBINANT) 20000 UNITS EX SOLR
CUTANEOUS | Status: DC | PRN
  Administered 2024-06-05: 20000 [IU] via TOPICAL

## 2024-06-05 MED ORDER — SODIUM CHLORIDE 0.9 % IV SOLN: 250.0000 mL | INTRAVENOUS | Status: DC

## 2024-06-05 MED ORDER — PHENYLEPHRINE 80 MCG/ML (10ML) SYRINGE FOR IV PUSH (FOR BLOOD PRESSURE SUPPORT)
PREFILLED_SYRINGE | INTRAVENOUS | Status: DC | PRN
  Administered 2024-06-05: 80 ug via INTRAVENOUS
  Administered 2024-06-05 (×2): 160 ug via INTRAVENOUS

## 2024-06-05 MED ORDER — METOPROLOL TARTRATE 25 MG/10 ML ORAL SUSPENSION: 12.5000 mg | Freq: Two times a day (BID) | ORAL | Status: DC

## 2024-06-05 MED ORDER — METOPROLOL TARTRATE 12.5 MG HALF TABLET
12.5000 mg | ORAL_TABLET | Freq: Two times a day (BID) | ORAL | Status: DC
  Filled 2024-06-05: qty 1

## 2024-06-05 MED ORDER — TRAMADOL HCL 50 MG PO TABS
50.0000 mg | ORAL_TABLET | ORAL | Status: DC | PRN
  Administered 2024-06-05: 50 mg via ORAL
  Administered 2024-06-06 – 2024-06-07 (×5): 100 mg via ORAL
  Filled 2024-06-05: qty 2
  Filled 2024-06-05: qty 1
  Filled 2024-06-05 (×4): qty 2

## 2024-06-05 MED ORDER — CHLORHEXIDINE GLUCONATE 0.12 % MT SOLN: 15.0000 mL | Freq: Once | OROMUCOSAL | Status: DC

## 2024-06-05 MED ORDER — MIDAZOLAM HCL (PF) 5 MG/ML IJ SOLN
INTRAMUSCULAR | Status: DC | PRN
  Administered 2024-06-05 (×4): 1 mg via INTRAVENOUS

## 2024-06-05 MED ORDER — POTASSIUM CHLORIDE 10 MEQ/50ML IV SOLN
10.0000 meq | INTRAVENOUS | Status: AC
  Administered 2024-06-05: 10 meq via INTRAVENOUS

## 2024-06-05 MED ORDER — MORPHINE SULFATE (PF) 2 MG/ML IV SOLN
1.0000 mg | INTRAVENOUS | Status: DC | PRN
  Administered 2024-06-05: 4 mg via INTRAVENOUS
  Administered 2024-06-05 – 2024-06-07 (×2): 2 mg via INTRAVENOUS
  Filled 2024-06-05 (×2): qty 1
  Filled 2024-06-05: qty 2
  Filled 2024-06-05: qty 1

## 2024-06-05 MED ORDER — HEMOSTATIC AGENTS (NO CHARGE) OPTIME
TOPICAL | Status: DC | PRN
  Administered 2024-06-05 (×4): 1 via TOPICAL

## 2024-06-05 MED ORDER — ACETAMINOPHEN 160 MG/5ML PO SOLN: 1000.0000 mg | Freq: Four times a day (QID) | ORAL | Status: DC

## 2024-06-05 MED ORDER — PLASMA-LYTE A IV SOLN: INTRAVENOUS | Status: DC | PRN

## 2024-06-05 MED ORDER — DEXTROSE 50 % IV SOLN: 0.0000 mL | INTRAVENOUS | Status: DC | PRN

## 2024-06-05 MED ORDER — EPINEPHRINE HCL 5 MG/250ML IV SOLN IN NS: 1.0000 ug/min | INTRAVENOUS | Status: DC

## 2024-06-05 MED ORDER — METOPROLOL TARTRATE 12.5 MG HALF TABLET
12.5000 mg | ORAL_TABLET | Freq: Once | ORAL | Status: AC
  Administered 2024-06-05: 12.5 mg via ORAL
  Filled 2024-06-05: qty 1

## 2024-06-05 MED ORDER — ROCURONIUM BROMIDE 10 MG/ML (PF) SYRINGE
PREFILLED_SYRINGE | INTRAVENOUS | Status: AC
  Filled 2024-06-05: qty 20

## 2024-06-05 MED ORDER — MIDAZOLAM HCL (PF) 2 MG/2ML IJ SOLN: 2.0000 mg | INTRAMUSCULAR | Status: DC | PRN

## 2024-06-05 MED ORDER — ACETAMINOPHEN 500 MG PO TABS
1000.0000 mg | ORAL_TABLET | Freq: Four times a day (QID) | ORAL | Status: DC
  Administered 2024-06-05 – 2024-06-10 (×15): 1000 mg via ORAL
  Filled 2024-06-05 (×16): qty 2

## 2024-06-05 MED ORDER — THROMBIN 20000 UNITS EX SOLR: OROMUCOSAL | Status: DC | PRN

## 2024-06-05 MED ORDER — SODIUM CHLORIDE 0.45 % IV SOLN: INTRAVENOUS | Status: AC | PRN

## 2024-06-05 MED ORDER — POTASSIUM CHLORIDE 10 MEQ/50ML IV SOLN
10.0000 meq | INTRAVENOUS | Status: AC
  Administered 2024-06-05 (×2): 10 meq via INTRAVENOUS

## 2024-06-05 MED ORDER — MAGNESIUM SULFATE 4 GM/100ML IV SOLN
INTRAVENOUS | Status: AC
  Filled 2024-06-05: qty 100

## 2024-06-05 NOTE — Op Note (Signed)
 CARDIOVASCULAR SURGERY OPERATIVE NOTE  06/05/2024  Surgeon:  Jane Clunes, MD  First Assistant: Dorise Fellers, MD  Experienced assistance was necessary for this case due to surgical complexity. Dr. Fellers assisted by with retraction of delicate tissues, exposure, suctioning, and suture management throughout the entire operation.  Preoperative Diagnosis:  Severe AS, severe AI, ascending aortic aneurysm   Postoperative Diagnosis:  Same  Procedure:  Median Sternotomy Extracorporeal circulation 3.   Replacement of the ascending aorta (hemi-arch) using a 28 mm Hemashield graft under deep hypothermic circulatory arrest 4.   Aortic valve replacement  Anesthesia:  General Endotracheal   Clinical History/Surgical Indication:  Jane Conley is a pleasant 69 year old woman with no significant past medical history, who was recently admitted with acute decompensated heart failure in the setting of severe aortic stenosis and reduced EF 40-45%. She was found to have a bicuspid valve with enlarged thoracic ascending aortic aneurysm. Her root and STJ are not dilated, but her RCA is high and the aorta dilates immediately distal to the STJ.  She presents today for AVR, replacement of ascending/hemiarch aorta and possible Bentall.   Preparation:  The patient was seen in the preoperative holding area and the correct patient, correct operation were confirmed with the patient after reviewing the medical record and catheterization. The consent was signed by me. Preoperative antibiotics were given. A pulmonary arterial line and radial arterial line were placed by the anesthesia team. The patient was taken back to the operating room and positioned supine on the operating room table. After being placed under general endotracheal anesthesia by the anesthesia team a foley catheter was placed. The neck, chest, abdomen, and both legs were prepped with betadine soap and solution and draped in the usual sterile manner. A  surgical time-out was taken and the correct patient and operative procedure were confirmed with the nursing and anesthesia staff.   Cardiopulmonary Bypass:  A median sternotomy was performed. The pericardium was opened in the midline. Right ventricular function appeared normal. The ascending aorta was enlarged and symmetrically dilated. There were no contraindications to aortic cannulation. The patient was fully systemically heparinized and the ACT was maintained > 400 sec. The ascending aorta was cannulated with a 22 F aortic cannula for arterial inflow. Venous cannulation was performed via the right atrial appendage using a two-staged venous cannula.   A retrograde cerebraplegia cannula was placed into the SVC through a pursestring suture and the SVC was encircled with a silastic tape. An LV vent was placed through the right superior pulmonary vein and a retrograde cardioplegia cannula was placed in the coronary sinus through the right atrium.  The patient was then cooled to 32 degrees centigrade.  During cooling, the heart began fibrillating so the aorta was cross-clamped and hyperkalemic retrograde cold blood cardioplegia was used to induce diastolic arrest.  CO2 was insufflated into the pericardium throughout the case to minimize intracardiac air.  Resection and grafting of ascending aortic aneurysm:  After 20 minutes of cooling the target temperature of 22 degrees centigrade was reached. Cerebral oximetry was 70% bilaterally. BIS was zero. The patient was given 150 mg of Etomidate and 125 mg of Solumedrol. The head was packed in ice. The bed was placed in steep trendelenburg. Circulatory arrest was begun and the blood volume emptied into the venous reservoir. Continuous retrograde cerebraplegia was begun and the SVC occluded with the silastic tape. The aortic cannula was removed. The aorta was transected just proximal to the innominate artery beveling the resection  out along the undersurface of the  aortic arch (Hemiarch replacement). The aortic diameter was measured at 28 mm here. A 28 x 10 mm Hemasheild Platinum vascular graft was prepared. Librarian, Academic # [id number], Lot # [id number]). It was anastomosed to the aortic arch in an end to end manner using 3-0 prolene continuous suture with a felt strip to reinforce the anastomisis. A light coating of Prevaleak was applied to seal needle holes. The arterial end of the bypass circuit was then connected to the 10mm side arm graft and circulation was slowly resumed. The tape was removed from the SVC. The aortic graft was cross-clamped proximal to the side arm graft and full CPB support was resumed. Circulatory arrest time was 31. Retrograde cerebraplegia time was 27.   Aortic valve replacement:  The native valve was bicuspid with severely calcified leaflets and moderate annular calcification. The left coronary ostium was low and close to the commisure whereas the right coronary ostium was quite high.   The native valve leaflets were excised and the annulus was decalcified with rongeurs. Care was taken to remove all particulate debris. The left ventricle was directly inspected for debris and then irrigated with ice saline solution. The annulus was sized and a size 27 mm Inspiris valve was chosen. The model number was [id number]  and the serial number was [id number]. While the valve was being prepared 2-0 Ethibond pledgeted horizontal mattress sutures were placed around the annulus with the pledgets in a sub-annular position. The sutures were placed through the sewing ring and the valve lowered into place. The sutures were secured sequentially with a Corknot. The valve seated nicely and the coronary ostia were not obstructed. The prosthetic valve leaflets moved normally and there was no sub-valvular obstruction. The proximal aorta anastomosis was then completed with the distal end of the single-side arm graft using a 3-0 prolene and felt strip.  Prevaleak was applied to seal  the needle holes in the grafts. A vent cannula was placed into the graft to remove any air. Deairing maneuvers were performed and the bed placed in trendelenburg position.  Completion:  The patient was rewarmed to 37 degrees Centigrade. The crossclamp was removed with a time of 155 minutes. There was spontaneous return of sinus rhythm. The distal and proximal anastomoses were checked for hemostasis. The vascular anastomoses all appeared hemostatic. Two temporary epicardial pacing wires were placed on the right atrium and a bipolar wire on the right ventricle. The patient was weaned from CPB without difficulty on no inotropes. CPB time was 199 minutes. Cardiac output was 4.2 LPM. The coronary sinus and LV vent catheters were removed.  On post-operative TEE, the RV and LV function looked normal.  The mean gradient across the aortic valve was 3 mmHg and there was no AI or PVL.  Heparin  was fully reversed with protamine  and the venous cannula was removed and the side-arm graft was divided.. Hemostasis was achieved. Three mediastinal (36Fr straight, 28Fr right angle, 24 Fr blake) drainage tubes were placed. The sternum was closed with stainless steel wires. The fascia was closed with continuous # 1 vicryl suture. The subcutaneous tissue was closed with 2-0 vicryl continuous suture. The skin was closed with 3-0 vicryl subcuticular suture. All sponge, needle, and instrument counts were reported correct at the end of the case. A wound vac was placed over the incision and gauze around the chest tubes which were connected to pleurevac suction. The patient was then transported to the surgical intensive care unit in  critical but stable condition.

## 2024-06-05 NOTE — Anesthesia Procedure Notes (Signed)
 Central Venous Catheter Insertion Performed by: Tilford Franky BIRCH, MD, anesthesiologist Start/End1/01/2025 7:15 AM, 06/05/2024 7:20 AM Patient location: Pre-op. Preanesthetic checklist: patient identified, IV checked, site marked, risks and benefits discussed, surgical consent, monitors and equipment checked, pre-op evaluation, timeout performed and anesthesia consent Hand hygiene performed  and maximum sterile barriers used  PA cath was placed.Swan type:thermodilution Attempts: 1 Patient tolerated the procedure well with no immediate complications.

## 2024-06-05 NOTE — Hospital Course (Signed)
" °  Referring: Anner Alm ORN, MD Primary Care: Shona Norleen PEDLAR, MD Primary Cardiologist:None    History of Present Illness:   At time of CT surgical consultation Jane Conley is a 69 y.o. female who presents for surgical evaluation of severe AS, mild-moderate AI and an ascending aortic aneurysm in the setting of a bicuspid aortic valve.  Her symptoms started about 2 months ago when she noticed worsening dyspnea with exertion, specifically when walking on an incline.  She was admitted on 11/30 with bad dyspnea and was found to have severe AS.  Further work up demonstrated a bicuspid valve, large annulus and 4.5 cm ascending aortic aneurysm.  She also has a history of HTN and has had difficulty controlling her BP but recently her SBP has been 130s at home.   She reports twinges of chest pain that come and go, mostly in the evening.  She has some dizziness with just sitting around and also has palpitations.  She denies syncope, leg swelling or history of stroke.    She is a never smoker and does not drink alcohol.  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     She was briefly discussed in TAVR conference last week and deemed a good surgical candidate given age and TAAA.  Dr. Daniel evaluated the patient and all relevant studies and recommended proceeding with repair of a ascending aortic aneurysm and aortic valve replacement.  The risks and benefits were discussed with the patient who agreed to proceed.  Hospital course: The patient was admitted electively on 06/05/2024 and taken to the operating room at which time underwent: "

## 2024-06-05 NOTE — Anesthesia Procedure Notes (Signed)
 Procedure Name: Intubation Date/Time: 06/05/2024 8:26 AM  Performed by: Claudene Arlin LABOR, CRNAPre-anesthesia Checklist: Patient identified, Emergency Drugs available, Suction available and Patient being monitored Patient Re-evaluated:Patient Re-evaluated prior to induction Oxygen Delivery Method: Circle system utilized Preoxygenation: Pre-oxygenation with 100% oxygen Induction Type: IV induction Ventilation: Two handed mask ventilation required and Oral airway inserted - appropriate to patient size Laryngoscope Size: Cleotilde and 2 Grade View: Grade II Tube type: Oral Tube size: 8.0 mm Number of attempts: 1 Airway Equipment and Method: Stylet Placement Confirmation: ETT inserted through vocal cords under direct vision, positive ETCO2 and breath sounds checked- equal and bilateral Secured at: 24 cm Tube secured with: Tape Dental Injury: Teeth and Oropharynx as per pre-operative assessment

## 2024-06-05 NOTE — Anesthesia Preprocedure Evaluation (Addendum)
"                                    Anesthesia Evaluation  Patient identified by MRN, date of birth, ID band Patient awake    Reviewed: Allergy & Precautions, NPO status , Patient's Chart, lab work & pertinent test results  History of Anesthesia Complications (+) PONV and history of anesthetic complications  Airway Mallampati: III  TM Distance: <3 FB Neck ROM: Full    Dental  (+) Teeth Intact, Dental Advisory Given   Pulmonary neg pulmonary ROS   breath sounds clear to auscultation       Cardiovascular hypertension, Pt. on home beta blockers and Pt. on medications +CHF   Rhythm:Regular Rate:Normal + Systolic murmurs Echo:   1. Left ventricular ejection fraction, by estimation, is 40 to 45%. The  left ventricle has mildly decreased function. The left ventricle  demonstrates global hypokinesis. The left ventricular internal cavity size  was severely dilated. There is severe  left ventricular hypertrophy. Left ventricular diastolic parameters are  consistent with Grade I diastolic dysfunction (impaired relaxation).   2. Right ventricular systolic function is normal. The right ventricular  size is not well visualized.   3. The mitral valve is grossly normal. Trivial mitral valve  regurgitation. No evidence of mitral stenosis.   4. Moderate aortic stenosis by mean gradient and DI, severe by peak  velocity. Cannot fully determine morphology of valve but appears likely  bicuspid. The aortic valve has an indeterminant number of cusps. There is  moderate calcification of the aortic  valve. Aortic valve regurgitation is mild to moderate. Moderate to severe  aortic valve stenosis.   5. Aortic dilatation noted. There is mild dilatation of the aortic root,  measuring 41 mm. There is mild dilatation of the ascending aorta,  measuring 42 mm.   6. The inferior vena cava is normal in size with greater than 50%  respiratory variability, suggesting right atrial pressure of 3  mmHg.     Neuro/Psych  PSYCHIATRIC DISORDERS Anxiety Depression    negative neurological ROS     GI/Hepatic negative GI ROS, Neg liver ROS,,,  Endo/Other  negative endocrine ROS    Renal/GU negative Renal ROS     Musculoskeletal negative musculoskeletal ROS (+)    Abdominal   Peds  Hematology negative hematology ROS (+)   Anesthesia Other Findings   Reproductive/Obstetrics                              Anesthesia Physical Anesthesia Plan  ASA: 4  Anesthesia Plan: General   Post-op Pain Management: Ofirmev  IV (intra-op)*   Induction: Intravenous  PONV Risk Score and Plan: 4 or greater and Ondansetron  and Treatment may vary due to age or medical condition  Airway Management Planned: Oral ETT  Additional Equipment: Arterial line, CVP, PA Cath, TEE and Ultrasound Guidance Line Placement  Intra-op Plan:   Post-operative Plan: Post-operative intubation/ventilation  Informed Consent: I have reviewed the patients History and Physical, chart, labs and discussed the procedure including the risks, benefits and alternatives for the proposed anesthesia with the patient or authorized representative who has indicated his/her understanding and acceptance.     Dental advisory given  Plan Discussed with: CRNA  Anesthesia Plan Comments:          Anesthesia Quick Evaluation  "

## 2024-06-05 NOTE — Procedures (Signed)
 Extubation Procedure Note  Patient Details:   Name: Jane Conley DOB: 1955/12/18 MRN: [id number]   Airway Documentation:    Vent end date: 06/05/24 Vent end time: 1757   Evaluation  O2 sats: stable throughout Complications: No apparent complications Patient did tolerate procedure well.     Yes  Pt extubated per order to 4L [place]. Pt had a positive cuff leak, able to state name, good cough, no stridor noted.   Shan LITTIE Collum 06/05/2024, 6:04 PM

## 2024-06-05 NOTE — Anesthesia Procedure Notes (Signed)
 Central Venous Catheter Insertion Performed by: Tilford Franky BIRCH, MD, anesthesiologist Start/End1/01/2025 7:05 AM, 06/05/2024 7:15 AM Patient location: Pre-op. Preanesthetic checklist: patient identified, IV checked, site marked, risks and benefits discussed, surgical consent, monitors and equipment checked, pre-op evaluation, timeout performed and anesthesia consent Position: Trendelenburg Lidocaine  1% used for infiltration and patient sedated Hand hygiene performed , maximum sterile barriers used  and Seldinger technique used Catheter size: 9 Fr Total catheter length 10. Central line was placed.Sheath introducer Swan type:thermodilution PA Cath depth:50 Procedure performed using ultrasound to evaluate access site. Ultrasound Notes:relevant anatomy identified, ultrasound used to visualize needle entry, vessel patent under ultrasound and image(s) printed for medical record. Attempts: 1 Following insertion, line sutured, dressing applied and Biopatch. Post procedure assessment: blood return through all ports, free fluid flow and no air  Patient tolerated the procedure well with no immediate complications.

## 2024-06-05 NOTE — Anesthesia Procedure Notes (Addendum)
 Arterial Line Insertion Start/End1/01/2025 7:25 AM, 06/05/2024 7:45 AM Performed by: Tilford Franky BIRCH, MD  Patient location: Pre-op. Preanesthetic checklist: patient identified, IV checked, site marked, risks and benefits discussed, surgical consent, monitors and equipment checked, pre-op evaluation, timeout performed and anesthesia consent Lidocaine  1% used for infiltration Left, radial was placed Catheter size: 5 French Hand hygiene performed  and maximum sterile barriers used   Attempts: 1 Following insertion, dressing applied and Biopatch. Post procedure assessment: normal and unchanged  Post procedure complications: second provider assisted. Patient tolerated the procedure well with no immediate complications. Additional procedure comments: CRNA #1 x2 unable to pass wire, CRNA #2 x2 unable to pass wire.   MDA 1st attempt - unable to pass wire, 2nd attempt - wire and catheter without resistance. [name]

## 2024-06-05 NOTE — Consult Note (Addendum)
 "  NAME:  Jane Conley, MRN:  [id number], DOB:  03/10/1956, LOS: 0 ADMISSION DATE:  06/05/2024, CONSULTATION DATE:  06/06/2023 REFERRING MD:  Dr. Daniel, CHIEF COMPLAINT:    History of Present Illness:  Ms. Coppa is a 69 year old female with HTN, moderate LVH, and severe aortic stenosis/aortic insufficiency and ascending aortic aneurysm who presents for surgical repair. She is now s/p biologic AVR and ascending aortic and hemiarch repair on 06/06/2023 with Dr. Daniel.   Pre-op Echo with EF 45%; post-op TEE with normal biventricular function  Intra-op received 760 mL cell saver, 2 plt, 1 FFP, 2800 mL crystalloid, and 500 albumin . EBL 1180 mL. UOP 1875 mL.   CPB: 199; aortic cross clamp: 37; circ arrest~27  Pertinent  Medical History  Per above  Significant Hospital Events: Including procedures, antibiotic start and stop dates in addition to other pertinent events   06/06/2023 OR s/p biologic AVR, ascending aorta and hemiarch repair. To ICU post-op intubated on 2 epi and low dose levophed    Interim History / Subjective:  Arrived to ICU intubated and sedated. On epi and low dose levophed  which was quickly weaned off after switching from VVI pacing to AAI pacing.   Objective    Blood pressure 132/63, pulse 67, temperature (P) 98.8 F (37.1 C), temperature source (P) Oral, resp. rate (!) 30, height (P) 5' 7.5 (1.715 m), weight (P) 93.6 kg, SpO2 97%. PAP: (8-22)/(-7-5) 12/0      Intake/Output Summary (Last 24 hours) at 06/05/2024 1514 Last data filed at 06/05/2024 1453 Gross per 24 hour  Intake 5407.08 ml  Output 1875 ml  Net 3532.08 ml   Filed Weights   06/05/24 0549  Weight: (P) 93.6 kg    Examination: General: acute on chronically ill appearing female, intubated and sedated  HENT: [place]/AT, PERRL Lungs: on mechanical ventilation, CTAB Cardiovascular: A-paced 80, normal S1 and S2, no mrg, 3 mediastinal chest tubes in place with minimal bloody drainage, 2A2V wires present, MSI dressing c/d/I   Abdomen: soft, hypoactive BS Extremities: warm, dry, no edema  Neuro: intubated and sedated GU: deferred  Resolved problem list   Assessment and Plan   Severe AS/AI s/p biologic AVR Ascending aortic aneurysm s/p ascending and hemiarch repair Anticipated post-op cardioplegia/vasoplegia - post-op management per TCTS - cbc, bmp now  - continue fixed dose epi 2 mcg/min; if CI improving and very hypertensive would consider dropping to 1 mcg/min - goal MAP 60-70 x 4 hours then liberalize 65-85; use norepi/clevidipine  as needed  - AAI pacing 80 for index/pressure, underlying sinus brady 50-60's with 1st degree AVB  - judicious fluid resuscitation with albumin  as indicated by clinical markers of perfusion  - avoid acidemia, coagulopathy, hypocalcemia, hypothermia - mediastinal drains per TCTS, monitor output closely  - multimodal pain control per protocol- oxycodone , tramadol , morphine  with bowel regimen - ASA to resume 1/10, metoprolol  when off inotropes/vasopresors  - complete post-op antibiotics - monitor electrolytes, replete PRN - replaced left femoral arterial line with right radial arterial line, would pull femoral prior to extubation   Post-operative vent management - rapid wean as able - VAP/ PPI - CXR/ ABG now  Expected post-operative ABLA Expected post-operative consumptive thrombocytopenia  - trend  - transfuse for hgb <8 or significant bleeding  - correct coagulopathy with significant bleeding   Stress hyperglycemia; A1c 5.4 - continue insulin  gtt per protocol with CBG goal 110-180  Labs   CBC: Recent Labs  Lab 06/03/24 1130 06/05/24 0821 06/05/24 1216 06/05/24  1241 06/05/24 1332 06/05/24 1336 06/05/24 1503  WBC 8.0  --   --   --   --   --   --   HGB 17.0*   < > 9.8* 9.5* 8.5* 9.2* 9.5*  HCT 48.5*   < > 27.5* 28.0* 25.0* 27.0* 28.0*  MCV 91.9  --   --   --   --   --   --   PLT 256  --  116*  --   --   --   --    < > = values in this interval not  displayed.    Basic Metabolic Panel: Recent Labs  Lab 06/03/24 1130 06/05/24 0821 06/05/24 0941 06/05/24 0959 06/05/24 1105 06/05/24 1207 06/05/24 1241 06/05/24 1332 06/05/24 1336 06/05/24 1503  NA 141   < > 144   < > 137 140 139 140 141 145  K 4.0   < > 3.3*   < > 4.0 3.8 4.3 3.6 3.6 3.3*  CL 104   < > 104  --  101 102  --   --  103 106  CO2 25  --   --   --   --   --   --   --   --   --   GLUCOSE 92   < > 143*  --  210* 173*  --   --  182* 114*  BUN 17   < > 11  --  13 11  --   --  10 9  CREATININE 1.15*   < > 0.90  --  0.90 0.70  --   --  0.80 0.80  CALCIUM  10.0  --   --   --   --   --   --   --   --   --    < > = values in this interval not displayed.   GFR: Estimated Creatinine Clearance: 79.8 mL/min (by C-G formula based on SCr of 0.8 mg/dL). Recent Labs  Lab 06/03/24 1130  WBC 8.0    Liver Function Tests: Recent Labs  Lab 06/03/24 1130  AST 21  ALT 18  ALKPHOS 87  BILITOT 0.7  PROT 7.7  ALBUMIN  4.7   No results for input(s): LIPASE, AMYLASE in the last 168 hours. No results for input(s): AMMONIA in the last 168 hours.  ABG    Component Value Date/Time   PHART 7.374 06/05/2024 1332   PCO2ART 35.3 06/05/2024 1332   PO2ART 185 (H) 06/05/2024 1332   HCO3 20.6 06/05/2024 1332   TCO2 23 06/05/2024 1503   ACIDBASEDEF 4.0 (H) 06/05/2024 1332   O2SAT 100 06/05/2024 1332     Coagulation Profile: Recent Labs  Lab 06/03/24 1130  INR 1.0    Cardiac Enzymes: No results for input(s): CKTOTAL, CKMB, CKMBINDEX, TROPONINI in the last 168 hours.  HbA1C: Hgb A1c MFr Bld  Date/Time Value Ref Range Status  06/03/2024 11:30 AM 5.4 4.8 - 5.6 % Final    Comment:    (NOTE) Diagnosis of Diabetes The following HbA1c ranges recommended by the American Diabetes Association (ADA) may be used as an aid in the diagnosis of diabetes mellitus.  Hemoglobin             Suggested A1C NGSP%              Diagnosis  <5.7                   Non  Diabetic  5.7-6.4                Pre-Diabetic  >6.4                   Diabetic  <7.0                   Glycemic control for                       adults with diabetes.    04/27/2024 01:30 AM 5.4 4.8 - 5.6 % Final    Comment:    (NOTE)         Prediabetes: 5.7 - 6.4         Diabetes: >6.4         Glycemic control for adults with diabetes: <7.0     CBG: No results for input(s): GLUCAP in the last 168 hours.  Review of Systems:   Unable to obtain  Past Medical History:  She,  has a past medical history of Aortic stenosis, Complication of anesthesia, Hypertension, and PONV (postoperative nausea and vomiting).   Surgical History:   Past Surgical History:  Procedure Laterality Date   ABDOMINAL HYSTERECTOMY     COLONOSCOPY     leaking aortic valve     RIGHT HEART CATH AND CORONARY ANGIOGRAPHY N/A 04/27/2024   Procedure: RIGHT HEART CATH AND CORONARY ANGIOGRAPHY;  Surgeon: Anner Alm ORN, MD;  Location: [facility] INVASIVE CV LAB;  Service: Cardiovascular;  Laterality: N/A;   TONSILLECTOMY       Social History:   reports that she has never smoked. She has never used smokeless tobacco. She reports that she does not drink alcohol and does not use drugs.   Family History:  Her family history is not on file.   Allergies Allergies[1]   Home Medications  Prior to Admission medications  Medication Sig Start Date End Date Taking? Authorizing Provider  albuterol (VENTOLIN HFA) 108 (90 Base) MCG/ACT inhaler Inhale 2 puffs into the lungs every 6 (six) hours as needed for shortness of breath or wheezing. 05/08/24  Yes [provider]  carvedilol  (COREG ) 3.125 MG tablet Take 1 tablet (3.125 mg total) by mouth 2 (two) times daily with a meal. 04/28/24  Yes Dibia, [name], MD  Coenzyme Q10 (CO Q 10) 100 MG CAPS Take 100 mg by mouth in the morning.   Yes [provider]  empagliflozin  (JARDIANCE ) 10 MG TABS tablet Take 1 tablet (10 mg total) by mouth daily. 04/29/24  Yes  Dibia, [name], MD  furosemide  (LASIX ) 20 MG tablet Take 1 tablet (20 mg total) by mouth daily. 04/28/24  Yes Dibia, Pauline E, MD  ibuprofen (ADVIL) 200 MG tablet Take 600 mg by mouth every 6 (six) hours as needed for mild pain (pain score 1-3) or headache.   Yes [provider]  lisinopril  (ZESTRIL ) 10 MG tablet Take 1 tablet (10 mg total) by mouth daily. 04/29/24  Yes Dibia, [name], MD  MAGNESIUM  GLYCINATE ADVANCED PO Take 200 mg by mouth 2 (two) times daily after a meal.   Yes [provider]  Ascorbic Acid (VITAMIN C) 1000 MG tablet Take 1,000 mg by mouth daily.    [provider]  cholecalciferol (VITAMIN D3) 10 MCG (400 UNIT) TABS tablet Take 400 Units by mouth in the morning.    [provider]  Cyanocobalamin (B-12 PO) Take 1 tablet by mouth daily.    [provider]  cyanocobalamin 1000 MCG tablet Take 1,000 mcg by mouth in the morning.    [provider]  Olopatadine HCl 0.2 % SOLN Place 1 drop into both eyes daily as needed (allergies, eye irritations).    [provider]  zinc gluconate 50 MG tablet Take 50 mg by mouth in the morning.    [provider]     Critical care time: 48 minutes    The patient is critically ill with multiple organ system failure and requires high complexity decision making for assessment and support, frequent evaluation and titration of therapies, advanced monitoring, review of radiographic studies and interpretation of complex data.   Critical Care Time devoted to patient care services, exclusive of separately billable procedures, described in this note is 45 minutes.  Rexene LOISE Blush, PA-C 06/05/2024 4:11 PM [facility]  For contact information, see Amion. If no response to pager, please call PCCM 2H APP. After hours, 7PM- 7AM, please call on call APP for 2H.          [1]  Allergies Allergen Reactions   Codeine Nausea And Vomiting and Swelling   "

## 2024-06-05 NOTE — Transfer of Care (Addendum)
 Immediate Anesthesia Transfer of Care Note  Patient: Jane Conley  Procedure(s) Performed: REPLACEMENT, AORTIC VALVE, OPEN UTILIZING INSPIRIS RESILIA  AORTIC VALVE (Chest) REPLACEMENT, AORTA, ASCENDING UTILIZING 28 X 10 MM HEMASHIELD PLATINUM GRAFT ECHOCARDIOGRAM, TRANSESOPHAGEAL, INTRAOPERATIVE  Patient Location: ICU  Anesthesia Type:General  Level of Consciousness: Patient remains intubated per anesthesia plan  Airway & Oxygen Therapy: Patient remains intubated per anesthesia plan and Patient placed on Ventilator (see vital sign flow sheet for setting)  Post-op Assessment: Report given to RN and Post -op Vital signs reviewed and stable  Post vital signs: Reviewed and stable  Last Vitals:  Vitals Value Taken Time  BP    Temp 36.2 C 06/05/24 15:54  Pulse 80 06/05/24 15:54  Resp 16 06/05/24 15:54  SpO2 95 % 06/05/24 15:54  Vitals shown include unfiled device data.  Last Pain:  Vitals:   06/05/24 0626  TempSrc:   PainSc: 0-No pain         Complications: No notable events documented.

## 2024-06-05 NOTE — Progress Notes (Signed)
" °  Echocardiogram Echocardiogram Transesophageal has been performed.  Jane Conley 06/05/2024, 12:37 PM "

## 2024-06-05 NOTE — Interval H&P Note (Signed)
 History and Physical Interval Note:  06/05/2024 6:08 AM  Jane Conley  has presented today for surgery, with the diagnosis of SEVERE AS MILD-MODERATE AI TAA BAV.  The various methods of treatment have been discussed with the patient and family. After consideration of risks, benefits and other options for treatment, the patient has consented to  Procedures with comments: REPLACEMENT, AORTIC VALVE, OPEN (N/A) REPLACEMENT, AORTA, ASCENDING (N/A) - HEMIARCH ASCENDING AORTA REPLACEMENT BENTALL PROCEDURE (N/A) ECHOCARDIOGRAM, TRANSESOPHAGEAL, INTRAOPERATIVE (N/A) as a surgical intervention.  The patient's history has been reviewed, patient examined, no change in status, stable for surgery.  I have reviewed the patient's chart and labs.  Questions were answered to the patient's satisfaction.     Con RAMAN [name]

## 2024-06-06 ENCOUNTER — Inpatient Hospital Stay (HOSPITAL_COMMUNITY)

## 2024-06-06 DIAGNOSIS — D696 Thrombocytopenia, unspecified: Secondary | ICD-10-CM

## 2024-06-06 DIAGNOSIS — R739 Hyperglycemia, unspecified: Secondary | ICD-10-CM

## 2024-06-06 DIAGNOSIS — I7121 Aneurysm of the ascending aorta, without rupture: Secondary | ICD-10-CM

## 2024-06-06 DIAGNOSIS — Z6833 Body mass index (BMI) 33.0-33.9, adult: Secondary | ICD-10-CM

## 2024-06-06 DIAGNOSIS — I350 Nonrheumatic aortic (valve) stenosis: Secondary | ICD-10-CM | POA: Diagnosis not present

## 2024-06-06 DIAGNOSIS — J90 Pleural effusion, not elsewhere classified: Secondary | ICD-10-CM | POA: Diagnosis not present

## 2024-06-06 DIAGNOSIS — E66811 Obesity, class 1: Secondary | ICD-10-CM

## 2024-06-06 DIAGNOSIS — I10 Essential (primary) hypertension: Secondary | ICD-10-CM

## 2024-06-06 DIAGNOSIS — Z952 Presence of prosthetic heart valve: Secondary | ICD-10-CM | POA: Diagnosis not present

## 2024-06-06 LAB — PREPARE PLATELET PHERESIS
Unit division: 0
Unit division: 0

## 2024-06-06 LAB — BASIC METABOLIC PANEL WITH GFR
Anion gap: 9 (ref 5–15)
BUN: 10 mg/dL (ref 8–23)
CO2: 23 mmol/L (ref 22–32)
Calcium: 7.9 mg/dL — ABNORMAL LOW (ref 8.9–10.3)
Chloride: 107 mmol/L (ref 98–111)
Creatinine, Ser: 0.76 mg/dL (ref 0.44–1.00)
GFR, Estimated: >60 mL/min
Glucose, Bld: 147 mg/dL — ABNORMAL HIGH (ref 70–99)
Potassium: 3.8 mmol/L (ref 3.5–5.1)
Sodium: 139 mmol/L (ref 135–145)

## 2024-06-06 LAB — GLUCOSE, CAPILLARY
Glucose-Capillary: 100 mg/dL — ABNORMAL HIGH (ref 70–99)
Glucose-Capillary: 115 mg/dL — ABNORMAL HIGH (ref 70–99)
Glucose-Capillary: 118 mg/dL — ABNORMAL HIGH (ref 70–99)
Glucose-Capillary: 124 mg/dL — ABNORMAL HIGH (ref 70–99)
Glucose-Capillary: 126 mg/dL — ABNORMAL HIGH (ref 70–99)
Glucose-Capillary: 130 mg/dL — ABNORMAL HIGH (ref 70–99)
Glucose-Capillary: 135 mg/dL — ABNORMAL HIGH (ref 70–99)
Glucose-Capillary: 136 mg/dL — ABNORMAL HIGH (ref 70–99)
Glucose-Capillary: 139 mg/dL — ABNORMAL HIGH (ref 70–99)
Glucose-Capillary: 144 mg/dL — ABNORMAL HIGH (ref 70–99)
Glucose-Capillary: 145 mg/dL — ABNORMAL HIGH (ref 70–99)
Glucose-Capillary: 148 mg/dL — ABNORMAL HIGH (ref 70–99)
Glucose-Capillary: 157 mg/dL — ABNORMAL HIGH (ref 70–99)
Glucose-Capillary: 160 mg/dL — ABNORMAL HIGH (ref 70–99)
Glucose-Capillary: 161 mg/dL — ABNORMAL HIGH (ref 70–99)
Glucose-Capillary: 168 mg/dL — ABNORMAL HIGH (ref 70–99)
Glucose-Capillary: 179 mg/dL — ABNORMAL HIGH (ref 70–99)

## 2024-06-06 LAB — BPAM FFP
Blood Product Expiration Date: 202601122359
ISSUE DATE / TIME: 202601091414
Unit Type and Rh: 6200

## 2024-06-06 LAB — BPAM PLATELET PHERESIS
Blood Product Expiration Date: 202601102359
Blood Product Expiration Date: 202601112359
ISSUE DATE / TIME: 202601091254
ISSUE DATE / TIME: 202601091254
Unit Type and Rh: 5100
Unit Type and Rh: 6200

## 2024-06-06 LAB — PREPARE FRESH FROZEN PLASMA

## 2024-06-06 LAB — CBC
HCT: 30.4 % — ABNORMAL LOW (ref 36.0–46.0)
Hemoglobin: 10.6 g/dL — ABNORMAL LOW (ref 12.0–15.0)
MCH: 32.3 pg (ref 26.0–34.0)
MCHC: 34.9 g/dL (ref 30.0–36.0)
MCV: 92.7 fL (ref 80.0–100.0)
Platelets: 127 K/uL — ABNORMAL LOW (ref 150–400)
RBC: 3.28 MIL/uL — ABNORMAL LOW (ref 3.87–5.11)
RDW: 12.5 % (ref 11.5–15.5)
WBC: 17.3 K/uL — ABNORMAL HIGH (ref 4.0–10.5)
nRBC: 0 % (ref 0.0–0.2)

## 2024-06-06 LAB — MAGNESIUM
Magnesium: 2.6 mg/dL — ABNORMAL HIGH (ref 1.7–2.4)
Magnesium: 2.4 mg/dL (ref 1.7–2.4)

## 2024-06-06 MED ORDER — POTASSIUM CHLORIDE CRYS ER 20 MEQ PO TBCR
40.0000 meq | EXTENDED_RELEASE_TABLET | Freq: Once | ORAL | Status: AC
  Administered 2024-06-06: 40 meq via ORAL
  Filled 2024-06-06: qty 2

## 2024-06-06 MED ORDER — CHLORHEXIDINE GLUCONATE CLOTH 2 % EX PADS
6.0000 | MEDICATED_PAD | Freq: Every day | CUTANEOUS | Status: DC
  Administered 2024-06-06 – 2024-06-08 (×3): 6 via TOPICAL

## 2024-06-06 MED ORDER — INSULIN ASPART 100 UNIT/ML IJ SOLN
0.0000 [IU] | INTRAMUSCULAR | Status: DC
  Administered 2024-06-06 – 2024-06-07 (×4): 2 [IU] via SUBCUTANEOUS
  Filled 2024-06-06 (×2): qty 2

## 2024-06-06 MED ORDER — ORAL CARE MOUTH RINSE: 15.0000 mL | OROMUCOSAL | Status: DC | PRN

## 2024-06-06 MED ORDER — ENOXAPARIN SODIUM 40 MG/0.4ML IJ SOSY
40.0000 mg | PREFILLED_SYRINGE | Freq: Every day | INTRAMUSCULAR | Status: DC
  Administered 2024-06-06 – 2024-06-09 (×4): 40 mg via SUBCUTANEOUS
  Filled 2024-06-06 (×4): qty 0.4

## 2024-06-06 MED ORDER — FUROSEMIDE 10 MG/ML IJ SOLN
40.0000 mg | Freq: Once | INTRAMUSCULAR | Status: AC
  Administered 2024-06-06: 40 mg via INTRAVENOUS
  Filled 2024-06-06: qty 4

## 2024-06-06 NOTE — Plan of Care (Signed)

## 2024-06-06 NOTE — Progress Notes (Signed)
 "  NAME:  Jane Conley, MRN:  [id number], DOB:  1956/04/06, LOS: 1 ADMISSION DATE:  06/05/2024, CONSULTATION DATE:  06/06/2023 REFERRING MD:  Dr. Daniel, CHIEF COMPLAINT:    History of Present Illness:  Jane Conley is a 69 year old female with HTN, moderate LVH, and severe aortic stenosis/aortic insufficiency and ascending aortic aneurysm who presents for surgical repair. She is now s/p biologic AVR and ascending aortic and hemiarch repair on 06/06/2023 with Dr. Daniel.   Pre-op Echo with EF 45%; post-op TEE with normal biventricular function  Intra-op received 760 mL cell saver, 2 plt, 1 FFP, 2800 mL crystalloid, and 500 albumin . EBL 1180 mL. UOP 1875 mL.   CPB: 199; aortic cross clamp: 37; circ arrest~27  Pertinent  Medical History  Per above  Significant Hospital Events: Including procedures, antibiotic start and stop dates in addition to other pertinent events   06/06/2023 OR s/p biologic AVR, ascending aorta and hemiarch repair. To ICU post-op intubated on 2 epi and low dose levophed    Interim History / Subjective:  Pain is controlled, had nausea once. Overall is feeling ok. No longer pacing. On epi 1mcg, cleviprex .   Objective    Blood pressure 132/63, pulse 62, temperature (!) 97.3 F (36.3 C), resp. rate 11, height (P) 5' 7.5 (1.715 m), weight 99.6 kg, SpO2 97%. PAP: (8-272)/(-7-25) 30/16 CVP:  [1 mmHg-27 mmHg] 8 mmHg CO:  [3.1 L/min-6.1 L/min] 6.1 L/min CI:  [1.5 L/min/m2-3 L/min/m2] 3 L/min/m2  Vent Mode: PSV;CPAP FiO2 (%):  [40 %-50 %] 40 % Set Rate:  [16 bmp] 16 bmp Vt Set:  [510 mL] 510 mL PEEP:  [5 cmH20] 5 cmH20 Pressure Support:  [5 cmH20-10 cmH20] 5 cmH20   Intake/Output Summary (Last 24 hours) at 06/06/2024 0742 Last data filed at 06/06/2024 0551 Gross per 24 hour  Intake 7066.7 ml  Output 5677 ml  Net 1389.7 ml   Filed Weights   06/05/24 0549 06/06/24 0500  Weight: (P) 93.6 kg 99.6 kg    Examination: General: elderly woman sitting up in bed awake and alert, smiling  this morning HENT: [place]/AT, eyes anicteric Lungs: breathing comfortably on [place], CTAB Cardiovascular: S1S2, RRR; NSR on tele.  Chest tubes with minimal output. Abdomen: mildly distended, nontender Extremities: +edema Neuro: awake, alert, answering questions appropriately. GU: foley with light yellow urine  BUN 10 Cr 0.76 WBC 17.3 H/H 10.6/30.4 Platelets 127  CXR personally reviewed> R pleural effusion, mild pulmonary edema. RIJ Swan, chest tubes.  EKG personally reviewed> NSR,  LAD, delayed R wave progression across precordials. First degree HB, normal Qtc.   Resolved problem list   Assessment and Plan   Severe AS & AI s/p biologic AVR Ascending aortic aneurysm s/p ascending and hemiarch repair Anticipated post-op cardioplegia, vasoplegia - post-op management per TCTS -CI >2, can wean epi off. Con't swan monitoring. Anticipate swan will come out later this morning.  -cleviprex  PRN to maintain MAP 65-85; if doing well off epi can start metoprolol  later this morning -no longer pacing -aspirin , statin -chest tubes, wires per TCTS -needs diuresis today vs tomorrow -progress mobility and diet as able -pulmonary hygiene, wean O2 as able for saturations >90%  R pleural effusion -needs diuresis  Thrombocytopenia post bypass, not in DIC -monitor, no current indication for transfusion  Stress hyperglycemia; A1c 5.4 -transition off insulin ; ok for SSI -hold PTA jardiance   H/o HTN -hold PTA coreg , lisinopril   Obesity, Body mass index is 33.88 kg/m (pended). -long term recommend weight loss  Labs   CBC: Recent Labs  Lab 06/03/24 1130 06/05/24 0821 06/05/24 1216 06/05/24 1241 06/05/24 1503 06/05/24 1548 06/05/24 1555 06/05/24 2156 06/06/24 0541  WBC 8.0  --   --   --   --   --  26.2* 11.3* 17.3*  HGB 17.0*   < > 9.8*   < > 9.5* 10.9* 11.0* 10.4* 10.6*  HCT 48.5*   < > 27.5*   < > 28.0* 32.0* 31.7* 29.8* 30.4*  MCV 91.9  --   --   --   --   --  94.1 92.8 92.7  PLT  256  --  116*  --   --   --  140* 104* 127*   < > = values in this interval not displayed.    Basic Metabolic Panel: Recent Labs  Lab 06/03/24 1130 06/05/24 0821 06/05/24 1336 06/05/24 1503 06/05/24 1548 06/05/24 1552 06/05/24 2156 06/06/24 0541  NA 141   < > 141 145 144 142 141 139  K 4.0   < > 3.6 3.3* 3.6 3.7 4.0 3.8  CL 104   < > 103 106  --  109 108 107  CO2 25  --   --   --   --  23 24 23   GLUCOSE 92   < > 182* 114*  --  133* 138* 147*  BUN 17   < > 10 9  --  10 11 10   CREATININE 1.15*   < > 0.80 0.80  --  0.86 0.80 0.76  CALCIUM  10.0  --   --   --   --  7.2* 7.8* 7.9*  MG  --   --   --   --   --   --   --  2.6*   < > = values in this interval not displayed.   GFR: Estimated Creatinine Clearance: 82.3 mL/min (by C-G formula based on SCr of 0.76 mg/dL). Recent Labs  Lab 06/03/24 1130 06/05/24 1555 06/05/24 2156 06/06/24 0541  WBC 8.0 26.2* 11.3* 17.3*    Liver Function Tests: Recent Labs  Lab 06/03/24 1130  AST 21  ALT 18  ALKPHOS 87  BILITOT 0.7  PROT 7.7  ALBUMIN  4.7     Critical care time:      This patient is critically ill with multiple organ system failure which requires frequent high complexity decision making, assessment, support, evaluation, and titration of therapies. This was completed through the application of advanced monitoring technologies and extensive interpretation of multiple databases. During this encounter critical care time was devoted to patient care services described in this note for 33 minutes.  Leita SHAUNNA Gaskins, DO 06/06/2024 8:01 AM [facility]  For contact information, see Amion. If no response to pager, please call PCCM 2H APP. After hours, 7PM- 7AM, please call on call APP for 2H.    "

## 2024-06-06 NOTE — Progress Notes (Signed)
 "     [address] [place] [employer] [address]             [phone]                1 Day Post-Op Procedures (LRB): REPLACEMENT, AORTIC VALVE, OPEN UTILIZING INSPIRIS RESILIA  AORTIC VALVE (N/A) REPLACEMENT, AORTA, ASCENDING UTILIZING 28 X 10 MM HEMASHIELD PLATINUM GRAFT (N/A) ECHOCARDIOGRAM, TRANSESOPHAGEAL, INTRAOPERATIVE (N/A)   Events: No events  _______________________________________________________________ Vitals: BP 132/63   Pulse 62   Temp (!) 97.3 F (36.3 C)   Resp 11   Ht (P) 5' 7.5 (1.715 m)   Wt 99.6 kg   SpO2 97%   BMI (P) 33.88 kg/m  Filed Weights   06/05/24 0549 06/06/24 0500  Weight: (P) 93.6 kg 99.6 kg     - Neuro: alert NAD  - Cardiovascular: sinus  Drips: none.   PAP: (22-272)/(1-25) 30/16 CVP:  [1 mmHg-27 mmHg] 8 mmHg CO:  [3.1 L/min-6.1 L/min] 6.1 L/min CI:  [1.5 L/min/m2-3 L/min/m2] 3 L/min/m2  - Pulm: EWOB  ABG    Component Value Date/Time   PHART 7.317 (L) 06/05/2024 1548   PCO2ART 44.3 06/05/2024 1548   PO2ART 96 06/05/2024 1548   HCO3 22.9 06/05/2024 1548   TCO2 24 06/05/2024 1548   ACIDBASEDEF 3.0 (H) 06/05/2024 1548   O2SAT 64.7 06/05/2024 1551    - Abd: ND - Extremity: edematous  .Intake/Output      01/09 0701 01/10 0700 01/10 0701 01/11 0700   I.V. (mL/kg) 3359.5 (33.7)    Blood 1540    IV Piggyback 2167.2    Total Intake(mL/kg) 7066.7 (71)    Urine (mL/kg/hr) 4200 (1.8)    Other 100    Blood 1180    Chest Tube 197    Total Output 5677    Net +1389.7            _______________________________________________________________ Labs:    Latest Ref Rng & Units 06/06/2024    5:41 AM 06/05/2024    9:56 PM 06/05/2024    3:55 PM  CBC  WBC 4.0 - 10.5 K/uL 17.3  11.3  26.2   Hemoglobin 12.0 - 15.0 g/dL 89.3  89.5  88.9   Hematocrit 36.0 - 46.0 % 30.4  29.8  31.7   Platelets 150 - 400 K/uL 127  104  140       Latest Ref Rng & Units 06/06/2024    5:41 AM 06/05/2024    9:56 PM 06/05/2024     3:52 PM  CMP  Glucose 70 - 99 mg/dL 852  861  866   BUN 8 - 23 mg/dL 10  11  10    Creatinine 0.44 - 1.00 mg/dL 9.23  9.19  9.13   Sodium 135 - 145 mmol/L 139  141  142   Potassium 3.5 - 5.1 mmol/L 3.8  4.0  3.7   Chloride 98 - 111 mmol/L 107  108  109   CO2 22 - 32 mmol/L 23  24  23    Calcium  8.9 - 10.3 mg/dL 7.9  7.8  7.2     CXR: PV congestion  _______________________________________________________________  Assessment and Plan: POD 1 s/p AVR/hemiarch  Neuro: pain controlled CV: will remove swan.  Keeping wires.   Pulm: IS, ambulationg Renal: creat stable.  Will diurese GI: on diet Heme: stable ID: afebrile Endo: SSI Dispo: continue ICU care.   Jane Conley 06/06/2024 10:11 AM   "

## 2024-06-06 NOTE — Plan of Care (Signed)
" °  Problem: Education: Goal: Knowledge of General Education information will improve Description: Including pain rating scale, medication(s)/side effects and non-pharmacologic comfort measures Outcome: Progressing   Problem: Health Behavior/Discharge Planning: Goal: Ability to manage health-related needs will improve Outcome: Progressing   Problem: Clinical Measurements: Goal: Ability to maintain clinical measurements within normal limits will improve Outcome: Progressing Goal: Will remain free from infection Outcome: Progressing Goal: Diagnostic test results will improve Outcome: Progressing Goal: Respiratory complications will improve Outcome: Progressing Goal: Cardiovascular complication will be avoided Outcome: Progressing   Problem: Activity: Goal: Risk for activity intolerance will decrease Outcome: Progressing   Problem: Nutrition: Goal: Adequate nutrition will be maintained Outcome: Progressing   Problem: Coping: Goal: Level of anxiety will decrease Outcome: Progressing   Problem: Elimination: Goal: Will not experience complications related to bowel motility Outcome: Progressing Goal: Will not experience complications related to urinary retention Outcome: Progressing   Problem: Pain Managment: Goal: General experience of comfort will improve and/or be controlled Outcome: Progressing   Problem: Safety: Goal: Ability to remain free from injury will improve Outcome: Progressing   Problem: Skin Integrity: Goal: Risk for impaired skin integrity will decrease Outcome: Progressing   Problem: Education: Goal: Will demonstrate proper wound care and an understanding of methods to prevent future damage Outcome: Progressing Goal: Knowledge of disease or condition will improve Outcome: Progressing Goal: Knowledge of the prescribed therapeutic regimen will improve Outcome: Progressing Goal: Individualized Educational Video(s) Outcome: Progressing   Problem:  Activity: Goal: Risk for activity intolerance will decrease Outcome: Progressing   Problem: Cardiac: Goal: Will achieve and/or maintain hemodynamic stability Outcome: Progressing   Problem: Clinical Measurements: Goal: Postoperative complications will be avoided or minimized Outcome: Progressing   Problem: Skin Integrity: Goal: Wound healing without signs and symptoms of infection Outcome: Progressing Goal: Risk for impaired skin integrity will decrease Outcome: Progressing   Problem: Respiratory: Goal: Respiratory status will improve Outcome: Progressing   Problem: Urinary Elimination: Goal: Ability to achieve and maintain adequate renal perfusion and functioning will improve Outcome: Progressing   "

## 2024-06-07 ENCOUNTER — Inpatient Hospital Stay (HOSPITAL_COMMUNITY)

## 2024-06-07 DIAGNOSIS — I7121 Aneurysm of the ascending aorta, without rupture: Secondary | ICD-10-CM | POA: Diagnosis not present

## 2024-06-07 DIAGNOSIS — E66811 Obesity, class 1: Secondary | ICD-10-CM | POA: Diagnosis not present

## 2024-06-07 DIAGNOSIS — Z952 Presence of prosthetic heart valve: Secondary | ICD-10-CM | POA: Diagnosis not present

## 2024-06-07 DIAGNOSIS — R739 Hyperglycemia, unspecified: Secondary | ICD-10-CM | POA: Diagnosis not present

## 2024-06-07 DIAGNOSIS — I10 Essential (primary) hypertension: Secondary | ICD-10-CM | POA: Diagnosis not present

## 2024-06-07 DIAGNOSIS — J90 Pleural effusion, not elsewhere classified: Secondary | ICD-10-CM | POA: Diagnosis not present

## 2024-06-07 DIAGNOSIS — K5900 Constipation, unspecified: Secondary | ICD-10-CM | POA: Diagnosis not present

## 2024-06-07 DIAGNOSIS — Z6833 Body mass index (BMI) 33.0-33.9, adult: Secondary | ICD-10-CM | POA: Diagnosis not present

## 2024-06-07 DIAGNOSIS — D696 Thrombocytopenia, unspecified: Secondary | ICD-10-CM | POA: Diagnosis not present

## 2024-06-07 DIAGNOSIS — I350 Nonrheumatic aortic (valve) stenosis: Secondary | ICD-10-CM | POA: Diagnosis not present

## 2024-06-07 LAB — CBC
HCT: 28.8 % — ABNORMAL LOW (ref 36.0–46.0)
Hemoglobin: 9.8 g/dL — ABNORMAL LOW (ref 12.0–15.0)
MCH: 32.7 pg (ref 26.0–34.0)
MCHC: 34 g/dL (ref 30.0–36.0)
MCV: 96 fL (ref 80.0–100.0)
Platelets: 94 K/uL — ABNORMAL LOW (ref 150–400)
RBC: 3 MIL/uL — ABNORMAL LOW (ref 3.87–5.11)
RDW: 12.7 % (ref 11.5–15.5)
WBC: 15.9 K/uL — ABNORMAL HIGH (ref 4.0–10.5)
nRBC: 0 % (ref 0.0–0.2)

## 2024-06-07 LAB — GLUCOSE, CAPILLARY
Glucose-Capillary: 113 mg/dL — ABNORMAL HIGH (ref 70–99)
Glucose-Capillary: 123 mg/dL — ABNORMAL HIGH (ref 70–99)
Glucose-Capillary: 124 mg/dL — ABNORMAL HIGH (ref 70–99)
Glucose-Capillary: 113 mg/dL — ABNORMAL HIGH (ref 70–99)
Glucose-Capillary: 99 mg/dL (ref 70–99)
Glucose-Capillary: 95 mg/dL (ref 70–99)

## 2024-06-07 LAB — BASIC METABOLIC PANEL WITH GFR
Anion gap: 8 (ref 5–15)
BUN: 12 mg/dL (ref 8–23)
CO2: 26 mmol/L (ref 22–32)
Calcium: 8.3 mg/dL — ABNORMAL LOW (ref 8.9–10.3)
Chloride: 104 mmol/L (ref 98–111)
Creatinine, Ser: 0.73 mg/dL (ref 0.44–1.00)
GFR, Estimated: >60 mL/min
Glucose, Bld: 110 mg/dL — ABNORMAL HIGH (ref 70–99)
Potassium: 4.2 mmol/L (ref 3.5–5.1)
Sodium: 137 mmol/L (ref 135–145)

## 2024-06-07 MED ORDER — POTASSIUM CHLORIDE CRYS ER 20 MEQ PO TBCR
40.0000 meq | EXTENDED_RELEASE_TABLET | Freq: Every day | ORAL | Status: DC
  Administered 2024-06-07 – 2024-06-10 (×4): 40 meq via ORAL
  Filled 2024-06-07 (×4): qty 2

## 2024-06-07 MED ORDER — FUROSEMIDE 40 MG PO TABS
40.0000 mg | ORAL_TABLET | Freq: Every day | ORAL | Status: DC
  Administered 2024-06-07: 40 mg via ORAL
  Filled 2024-06-07: qty 1

## 2024-06-07 MED ORDER — METOCLOPRAMIDE HCL 5 MG/ML IJ SOLN
10.0000 mg | Freq: Three times a day (TID) | INTRAMUSCULAR | Status: AC
  Administered 2024-06-07 (×3): 10 mg via INTRAVENOUS
  Filled 2024-06-07 (×3): qty 2

## 2024-06-07 MED ORDER — LACTULOSE 10 GM/15ML PO SOLN
20.0000 g | Freq: Once | ORAL | Status: AC
  Administered 2024-06-07: 20 g via ORAL
  Filled 2024-06-07: qty 30

## 2024-06-07 MED ORDER — INSULIN ASPART 100 UNIT/ML IJ SOLN: 0.0000 [IU] | Freq: Three times a day (TID) | INTRAMUSCULAR | Status: DC

## 2024-06-07 MED ORDER — SENNOSIDES-DOCUSATE SODIUM 8.6-50 MG PO TABS
2.0000 | ORAL_TABLET | Freq: Every day | ORAL | Status: DC
  Administered 2024-06-07: 2 via ORAL
  Filled 2024-06-07: qty 2

## 2024-06-07 MED ORDER — METOPROLOL TARTRATE 25 MG PO TABS
25.0000 mg | ORAL_TABLET | Freq: Two times a day (BID) | ORAL | Status: DC
  Administered 2024-06-07 – 2024-06-10 (×6): 25 mg via ORAL
  Filled 2024-06-07 (×6): qty 1

## 2024-06-07 NOTE — Progress Notes (Signed)
 Epicardial pacer wires removed @ 1000 per order. Bedrest begins @ 1000 x1 hour

## 2024-06-07 NOTE — Progress Notes (Signed)
 "     [address] [place] [employer] [address]             [phone]                2 Days Post-Op Procedures (LRB): REPLACEMENT, AORTIC VALVE, OPEN UTILIZING INSPIRIS RESILIA  AORTIC VALVE (N/A) REPLACEMENT, AORTA, ASCENDING UTILIZING 28 X 10 MM HEMASHIELD PLATINUM GRAFT (N/A) ECHOCARDIOGRAM, TRANSESOPHAGEAL, INTRAOPERATIVE (N/A)   Events: No events  _______________________________________________________________ Vitals: BP 127/78   Pulse 62   Temp 98 F (36.7 C) (Oral)   Resp (!) 0   Ht (P) 5' 7.5 (1.715 m)   Wt 97.3 kg   SpO2 94%   BMI (P) 33.10 kg/m  Filed Weights   06/05/24 0549 06/06/24 0500 06/07/24 0500  Weight: (P) 93.6 kg 99.6 kg 97.3 kg     - Neuro: alert NAD  - Cardiovascular: sinus  Drips: none.   PAP: (28-41)/(14-22) 36/18 CVP:  [4 mmHg-11 mmHg] 6 mmHg CO:  [4 L/min-5.7 L/min] 4.8 L/min CI:  [1.9 L/min/m2-2.8 L/min/m2] 2.3 L/min/m2  - Pulm: EWOB  ABG    Component Value Date/Time   PHART 7.317 (L) 06/05/2024 1548   PCO2ART 44.3 06/05/2024 1548   PO2ART 96 06/05/2024 1548   HCO3 22.9 06/05/2024 1548   TCO2 24 06/05/2024 1548   ACIDBASEDEF 3.0 (H) 06/05/2024 1548   O2SAT 64.7 06/05/2024 1551    - Abd: ND - Extremity: edematous  .Intake/Output      01/10 0701 01/11 0700 01/11 0701 01/12 0700   P.O. 240    I.V. (mL/kg) 124 (1.3)    Blood     IV Piggyback 300    Total Intake(mL/kg) 664 (6.8)    Urine (mL/kg/hr) 1895 (0.8)    Emesis/NG output 0    Other     Blood     Chest Tube 370    Total Output 2265    Net -1601         Urine Occurrence 1 x    Emesis Occurrence 20 x       _______________________________________________________________ Labs:    Latest Ref Rng & Units 06/07/2024    5:00 AM 06/06/2024    5:41 AM 06/05/2024    9:56 PM  CBC  WBC 4.0 - 10.5 K/uL 15.9  17.3  11.3   Hemoglobin 12.0 - 15.0 g/dL 9.8  89.3  89.5   Hematocrit 36.0 - 46.0 % 28.8  30.4  29.8   Platelets 150 - 400 K/uL 94  127   104       Latest Ref Rng & Units 06/07/2024    5:00 AM 06/06/2024    5:41 AM 06/05/2024    9:56 PM  CMP  Glucose 70 - 99 mg/dL 889  852  861   BUN 8 - 23 mg/dL 12  10  11    Creatinine 0.44 - 1.00 mg/dL 9.26  9.23  9.19   Sodium 135 - 145 mmol/L 137  139  141   Potassium 3.5 - 5.1 mmol/L 4.2  3.8  4.0   Chloride 98 - 111 mmol/L 104  107  108   CO2 22 - 32 mmol/L 26  23  24    Calcium  8.9 - 10.3 mg/dL 8.3  7.9  7.8     CXR: PV congestion  _______________________________________________________________  Assessment and Plan: POD 2 s/p AVR/hemiarch  Neuro: pain controlled CV: will remove wires.    Pulm: IS, ambulationg.  Will remove chest tubes, and keep blake drain Renal: creat stable.  Will diurese GI: on diet Heme: stable ID: afebrile Endo: SSI Dispo: continue ICU care.   Jane Conley 06/07/2024 7:51 AM   "

## 2024-06-07 NOTE — Progress Notes (Signed)
 "  NAME:  Jane Conley, MRN:  [id number], DOB:  04/20/1956, LOS: 2 ADMISSION DATE:  06/05/2024, CONSULTATION DATE:  06/06/2023 REFERRING MD:  Dr. Daniel, CHIEF COMPLAINT:    History of Present Illness:  Jane Conley is a 69 year old female with HTN, moderate LVH, and severe aortic stenosis/aortic insufficiency and ascending aortic aneurysm who presents for surgical repair. She is now s/p biologic AVR and ascending aortic and hemiarch repair on 06/06/2023 with Dr. Daniel.   Pre-op Echo with EF 45%; post-op TEE with normal biventricular function  Intra-op received 760 mL cell saver, 2 plt, 1 FFP, 2800 mL crystalloid, and 500 albumin . EBL 1180 mL. UOP 1875 mL.   CPB: 199; aortic cross clamp: 37; circ arrest~27  Pertinent  Medical History  Per above  Significant Hospital Events: Including procedures, antibiotic start and stop dates in addition to other pertinent events   06/06/2023 OR s/p biologic AVR, ascending aorta and hemiarch repair. To ICU post-op intubated on 2 epi and low dose levophed   1/10 inotropes discontinued, lines removed  Interim History / Subjective:  Nausea this morning, not getting better with zofran . Still feels a little lethargic today. Pain is ok. Objective    Blood pressure 127/78, pulse 62, temperature 98 F (36.7 C), temperature source Oral, resp. rate (!) 0, height (P) 5' 7.5 (1.715 m), weight 97.3 kg, SpO2 94%. PAP: (28-41)/(14-22) 36/18 CVP:  [4 mmHg-13 mmHg] 6 mmHg CO:  [4 L/min-5.8 L/min] 4.8 L/min CI:  [1.9 L/min/m2-2.8 L/min/m2] 2.3 L/min/m2      Intake/Output Summary (Last 24 hours) at 06/07/2024 0700 Last data filed at 06/07/2024 0629 Gross per 24 hour  Intake 663.96 ml  Output 2265 ml  Net -1601.04 ml   Filed Weights   06/05/24 0549 06/06/24 0500 06/07/24 0500  Weight: (P) 93.6 kg 99.6 kg 97.3 kg    Examination: General: elderly woman sitting up in the chair in NAD HENT: [place]/AT, eyes anicteric Lungs: breathing comfortably on [place], reduced basilar breath  sounds. No conversational dyspnea. Cardiovascular: S1S2, RRR, mild rub from chest tubes. Minimal serosanguinous chest tube output. Abdomen: obese, soft, NT Extremities: ankle edema improving Neuro: awake, alert, moving all extremities, answering questions appropriately  Chest tube output: 130cc overnight.   BUN 12  Cr 0.73 WBC 15.9 H/H 9.8/28.8 Platelets 94  CXR personally reviewed> R pleural effusion, RIJ CVC.    Resolved problem list   Assessment and Plan   Severe AS & AI s/p biologic AVR Ascending aortic aneurysm s/p ascending and hemiarch repair Anticipated post-op cardioplegia, vasoplegia - post-op management per TCTS - pulling chest tubes today, leaving penrose -remains off inotropes and pressors -d/c A-line -aspirin  dialy -lasix  40mg  today -increase metoprolol  to 25mg  BID -progress mobility and diet as able -pulmonary hygiene, wean off O2 to maintain SpO2 >90% -pain control per protocol- tramadol , oxy, morphine   Nausea -restart reglan  -con't zofran   R pleural effusion -con't diuresis  Thrombocytopenia post bypass -trend  Stress hyperglycemia; A1c 5.4 -SSI PRN -holding PTA jardiance   H/o HTN -con't holding PTA lisinopril  and coreg   Obesity, Body mass index is 33.1 kg/m (pended). -long term recommend weight loss  Labs   CBC: Recent Labs  Lab 06/03/24 1130 06/05/24 0821 06/05/24 1216 06/05/24 1241 06/05/24 1548 06/05/24 1555 06/05/24 2156 06/06/24 0541 06/07/24 0500  WBC 8.0  --   --   --   --  26.2* 11.3* 17.3* 15.9*  HGB 17.0*   < > 9.8*   < > 10.9* 11.0* 10.4* 10.6*  9.8*  HCT 48.5*   < > 27.5*   < > 32.0* 31.7* 29.8* 30.4* 28.8*  MCV 91.9  --   --   --   --  94.1 92.8 92.7 96.0  PLT 256  --  116*  --   --  140* 104* 127* 94*   < > = values in this interval not displayed.    Basic Metabolic Panel: Recent Labs  Lab 06/03/24 1130 06/05/24 0821 06/05/24 1503 06/05/24 1548 06/05/24 1552 06/05/24 2156 06/06/24 0541 06/06/24 1850  06/07/24 0500  NA 141   < > 145 144 142 141 139  --  137  K 4.0   < > 3.3* 3.6 3.7 4.0 3.8  --  4.2  CL 104   < > 106  --  109 108 107  --  104  CO2 25  --   --   --  23 24 23   --  26  GLUCOSE 92   < > 114*  --  133* 138* 147*  --  110*  BUN 17   < > 9  --  10 11 10   --  12  CREATININE 1.15*   < > 0.80  --  0.86 0.80 0.76  --  0.73  CALCIUM  10.0  --   --   --  7.2* 7.8* 7.9*  --  8.3*  MG  --   --   --   --   --   --  2.6* 2.4  --    < > = values in this interval not displayed.   GFR: Estimated Creatinine Clearance: 81.4 mL/min (by C-G formula based on SCr of 0.73 mg/dL). Recent Labs  Lab 06/05/24 1555 06/05/24 2156 06/06/24 0541 06/07/24 0500  WBC 26.2* 11.3* 17.3* 15.9*    Liver Function Tests: Recent Labs  Lab 06/03/24 1130  AST 21  ALT 18  ALKPHOS 87  BILITOT 0.7  PROT 7.7  ALBUMIN  4.7     Critical care time:       Leita SHAUNNA Gaskins, DO 06/07/2024 10:02 AM [facility]  For contact information, see Amion. If no response to pager, please call PCCM 2H APP. After hours, 7PM- 7AM, please call on call APP for 2H.    "

## 2024-06-07 NOTE — Plan of Care (Signed)

## 2024-06-07 NOTE — Plan of Care (Signed)

## 2024-06-08 ENCOUNTER — Inpatient Hospital Stay (HOSPITAL_COMMUNITY)

## 2024-06-08 DIAGNOSIS — I10 Essential (primary) hypertension: Secondary | ICD-10-CM | POA: Diagnosis not present

## 2024-06-08 DIAGNOSIS — I7121 Aneurysm of the ascending aorta, without rupture: Secondary | ICD-10-CM | POA: Diagnosis not present

## 2024-06-08 DIAGNOSIS — I350 Nonrheumatic aortic (valve) stenosis: Secondary | ICD-10-CM | POA: Diagnosis not present

## 2024-06-08 DIAGNOSIS — K5900 Constipation, unspecified: Secondary | ICD-10-CM

## 2024-06-08 DIAGNOSIS — J90 Pleural effusion, not elsewhere classified: Secondary | ICD-10-CM | POA: Diagnosis not present

## 2024-06-08 LAB — BASIC METABOLIC PANEL WITH GFR
Anion gap: 9 (ref 5–15)
BUN: 10 mg/dL (ref 8–23)
CO2: 26 mmol/L (ref 22–32)
Calcium: 8.4 mg/dL — ABNORMAL LOW (ref 8.9–10.3)
Chloride: 104 mmol/L (ref 98–111)
Creatinine, Ser: 0.63 mg/dL (ref 0.44–1.00)
GFR, Estimated: >60 mL/min
Glucose, Bld: 99 mg/dL (ref 70–99)
Potassium: 4 mmol/L (ref 3.5–5.1)
Sodium: 139 mmol/L (ref 135–145)

## 2024-06-08 LAB — GLUCOSE, CAPILLARY
Glucose-Capillary: 88 mg/dL (ref 70–99)
Glucose-Capillary: 96 mg/dL (ref 70–99)

## 2024-06-08 LAB — CBC
HCT: 29.5 % — ABNORMAL LOW (ref 36.0–46.0)
Hemoglobin: 9.7 g/dL — ABNORMAL LOW (ref 12.0–15.0)
MCH: 32.1 pg (ref 26.0–34.0)
MCHC: 32.9 g/dL (ref 30.0–36.0)
MCV: 97.7 fL (ref 80.0–100.0)
Platelets: 122 K/uL — ABNORMAL LOW (ref 150–400)
RBC: 3.02 MIL/uL — ABNORMAL LOW (ref 3.87–5.11)
RDW: 12.6 % (ref 11.5–15.5)
WBC: 11.6 K/uL — ABNORMAL HIGH (ref 4.0–10.5)
nRBC: 0 % (ref 0.0–0.2)

## 2024-06-08 MED ORDER — BISACODYL 10 MG RE SUPP: 10.0000 mg | Freq: Once | RECTAL | Status: DC

## 2024-06-08 MED ORDER — SODIUM CHLORIDE 0.9% FLUSH: 3.0000 mL | INTRAVENOUS | Status: DC | PRN

## 2024-06-08 MED ORDER — SORBITOL 70 % SOLN
30.0000 mL | Freq: Once | Status: AC
  Administered 2024-06-08: 30 mL via ORAL
  Filled 2024-06-08: qty 30

## 2024-06-08 MED ORDER — FUROSEMIDE 40 MG PO TABS: 40.0000 mg | ORAL_TABLET | Freq: Every day | ORAL | Status: DC

## 2024-06-08 MED ORDER — ~~LOC~~ CARDIAC SURGERY, PATIENT & FAMILY EDUCATION
Freq: Once | Status: AC
  Administered 2024-06-08: 1

## 2024-06-08 MED ORDER — SODIUM CHLORIDE 0.9 % IV SOLN: 250.0000 mL | INTRAVENOUS | Status: AC | PRN

## 2024-06-08 MED ORDER — FUROSEMIDE 10 MG/ML IJ SOLN
40.0000 mg | Freq: Once | INTRAMUSCULAR | Status: AC
  Administered 2024-06-08: 40 mg via INTRAVENOUS
  Filled 2024-06-08: qty 4

## 2024-06-08 MED ORDER — SODIUM CHLORIDE 0.9% FLUSH
3.0000 mL | Freq: Two times a day (BID) | INTRAVENOUS | Status: DC
  Administered 2024-06-08 – 2024-06-10 (×5): 3 mL via INTRAVENOUS

## 2024-06-08 MED FILL — Calcium Chloride Inj 10%: INTRAVENOUS | Qty: 10 | Status: AC

## 2024-06-08 MED FILL — Potassium Chloride Inj 2 mEq/ML: INTRAVENOUS | Qty: 40 | Status: AC

## 2024-06-08 MED FILL — Lidocaine HCl Local Preservative Free (PF) Inj 2%: INTRAMUSCULAR | Qty: 14 | Status: AC

## 2024-06-08 MED FILL — Heparin Sodium (Porcine) Inj 1000 Unit/ML: Qty: 1000 | Status: AC

## 2024-06-08 MED FILL — Electrolyte-R (PH 7.4) Solution: INTRAVENOUS | Qty: 5000 | Status: AC

## 2024-06-08 MED FILL — Mannitol IV Soln 20%: INTRAVENOUS | Qty: 500 | Status: AC

## 2024-06-08 MED FILL — Sodium Bicarbonate IV Soln 8.4%: INTRAVENOUS | Qty: 100 | Status: AC

## 2024-06-08 MED FILL — Heparin Sodium (Porcine) Inj 1000 Unit/ML: INTRAMUSCULAR | Qty: 20 | Status: AC

## 2024-06-08 MED FILL — Sodium Chloride IV Soln 0.9%: INTRAVENOUS | Qty: 3000 | Status: AC

## 2024-06-08 MED FILL — Albumin, Human Inj 5%: INTRAVENOUS | Qty: 250 | Status: AC

## 2024-06-08 NOTE — Plan of Care (Signed)

## 2024-06-08 NOTE — Anesthesia Postprocedure Evaluation (Signed)
"   Anesthesia Post Note  Patient: Jane Conley  Procedure(s) Performed: REPLACEMENT, AORTIC VALVE, OPEN UTILIZING INSPIRIS RESILIA  AORTIC VALVE (Chest) REPLACEMENT, AORTA, ASCENDING UTILIZING 28 X 10 MM HEMASHIELD PLATINUM GRAFT ECHOCARDIOGRAM, TRANSESOPHAGEAL, INTRAOPERATIVE     Patient location during evaluation: SICU Anesthesia Type: General Level of consciousness: sedated Pain management: pain level controlled Vital Signs Assessment: post-procedure vital signs reviewed and stable Respiratory status: patient remains intubated per anesthesia plan Cardiovascular status: stable Postop Assessment: no apparent nausea or vomiting Anesthetic complications: no   No notable events documented.              Franky JONETTA Bald      "

## 2024-06-08 NOTE — Progress Notes (Signed)
 3 Days Post-Op Procedures (LRB): REPLACEMENT, AORTIC VALVE, OPEN UTILIZING INSPIRIS RESILIA  AORTIC VALVE (N/A) REPLACEMENT, AORTA, ASCENDING UTILIZING 28 X 10 MM HEMASHIELD PLATINUM GRAFT (N/A) ECHOCARDIOGRAM, TRANSESOPHAGEAL, INTRAOPERATIVE (N/A) Subjective: Doing well this morning.  Has been ambulating and feels better than yesterday Some nausea but no emesis, no BM after lactulose  yesterday Weight about 7 lbs above pre-op and feels edematous.  Objective: Vital signs in last 24 hours: BP 128/72   Pulse 70   Temp 97.8 F (36.6 C) (Oral)   Resp (!) 29   Ht (P) 5' 7.5 (1.715 m)   Wt 96.8 kg   SpO2 91%   BMI (P) 32.93 kg/m  Filed Weights   06/06/24 0500 06/07/24 0500 06/08/24 0423  Weight: 99.6 kg 97.3 kg 96.8 kg    Hemodynamic parameters for last 24 hours:    Intake/Output from previous day: 01/11 0701 - 01/12 0700 In: 100 [IV Piggyback:100] Out: 1400 [Urine:1300; Chest Tube:100] Intake/Output this shift: No intake/output data recorded.  Physical Exam: General - Resting comfortably in chair CV - RRR Resp - Unlabored on RA Abd - Soft, ND/NT Ext - Moderate edema  Lab Results:    Latest Ref Rng & Units 06/08/2024    4:18 AM 06/07/2024    5:00 AM 06/06/2024    5:41 AM  CBC  WBC 4.0 - 10.5 K/uL 11.6  15.9  17.3   Hemoglobin 12.0 - 15.0 g/dL 9.7  9.8  89.3   Hematocrit 36.0 - 46.0 % 29.5  28.8  30.4   Platelets 150 - 400 K/uL 122  94  127       Latest Ref Rng & Units 06/08/2024    4:18 AM 06/07/2024    5:00 AM 06/06/2024    5:41 AM  CMP  Glucose 70 - 99 mg/dL 99  889  852   BUN 8 - 23 mg/dL 10  12  10    Creatinine 0.44 - 1.00 mg/dL 9.36  9.26  9.23   Sodium 135 - 145 mmol/L 139  137  139   Potassium 3.5 - 5.1 mmol/L 4.0  4.2  3.8   Chloride 98 - 111 mmol/L 104  104  107   CO2 22 - 32 mmol/L 26  26  23    Calcium  8.9 - 10.3 mg/dL 8.4  8.3  7.9     CXR: Moderate effusions yesterday with some atelectasis  Assessment/Plan: S/P Procedures  (LRB): REPLACEMENT, AORTIC VALVE, OPEN UTILIZING INSPIRIS RESILIA  AORTIC VALVE (N/A) REPLACEMENT, AORTA, ASCENDING UTILIZING 28 X 10 MM HEMASHIELD PLATINUM GRAFT (N/A) ECHOCARDIOGRAM, TRANSESOPHAGEAL, INTRAOPERATIVE (N/A) POD3 s/p bioAVR/ascending and hemiarch replacement NEURO- intact  Pain control PRN CV- in SR around 60-70 bpm             Tolerating metop 25 BID RESP- Saturating well on RA              Continue IS, pulm hygiene, ambulation  Remove mediastinal tube RENAL- creatinine and lytes Ok  Weight up 7 lbs from pre-op             IV lasix  40 x 1 today, resume PO tomorrow GI- tolerating diet  BM: No BM yet, will try sorbitol  today.  Passing gas Endo- BG well controlled ID- NAI DVT ppx - SCD + Lovenox   Dispo: 4E today   LOS: 3 days    Jane Conley 06/08/2024

## 2024-06-08 NOTE — Discharge Summary (Signed)
 "      [address] [place] [address]             [phone]        Physician Discharge Summary  Patient ID: Jane Conley MRN: [id number] DOB/AGE: Jun 18, 1955 69 y.o.  Admit date: 06/05/2024 Discharge date: 06/10/2024  Admission Diagnoses:  Patient Active Problem List   Diagnosis Date Noted   S/P AVR (aortic valve replacement) 06/05/2024   Hx of ascending aorta replacement 06/05/2024   Thoracic aortic aneurysm 04/27/2024   Acute on chronic diastolic CHF (congestive heart failure) (HCC) 04/27/2024   Prolonged QT interval 04/27/2024   Hypokalemia 04/27/2024   Acute diastolic CHF (congestive heart failure) (HCC) 04/27/2024   Palpitations 04/27/2024   Hyperlipidemia 05/25/2014   HTN (hypertension) 05/25/2014   Depression, acute 05/25/2014   Anxiety disorder 05/25/2014   Obesity due to excess calories 05/25/2014   Acute upper respiratory infection 05/25/2014     Discharge Diagnoses:  Patient Active Problem List   Diagnosis Date Noted   S/P AVR (aortic valve replacement) 06/05/2024   Hx of ascending aorta replacement 06/05/2024   Thoracic aortic aneurysm 04/27/2024   Acute on chronic diastolic CHF (congestive heart failure) (HCC) 04/27/2024   Prolonged QT interval 04/27/2024   Hypokalemia 04/27/2024   Acute diastolic CHF (congestive heart failure) (HCC) 04/27/2024   Palpitations 04/27/2024   Hyperlipidemia 05/25/2014   HTN (hypertension) 05/25/2014   Depression, acute 05/25/2014   Anxiety disorder 05/25/2014   Obesity due to excess calories 05/25/2014   Acute upper respiratory infection 05/25/2014     Discharged Condition: good   Referring: Anner Alm ORN, MD Primary Care: Shona Norleen PEDLAR, MD Primary Cardiologist:None    History of Present Illness:   At time of CT surgical consultation Jane Conley is a 69 y.o. female who presents for surgical evaluation of severe AS, mild-moderate AI and an ascending aortic aneurysm in the setting of  a bicuspid aortic valve.  Her symptoms started about 2 months ago when she noticed worsening dyspnea with exertion, specifically when walking on an incline.  She was admitted on 11/30 with bad dyspnea and was found to have severe AS.  Further work up demonstrated a bicuspid valve, large annulus and 4.5 cm ascending aortic aneurysm.  She also has a history of HTN and has had difficulty controlling her BP but recently her SBP has been 130s at home.   She reports twinges of chest pain that come and go, mostly in the evening.  She has some dizziness with just sitting around and also has palpitations.  She denies syncope, leg swelling or history of stroke.    She is a never smoker and does not drink alcohol.  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     She was briefly discussed in TAVR conference last week and deemed a good surgical candidate given age and TAAA.  Dr. Daniel evaluated the patient and all relevant studies and recommended proceeding with repair of a ascending aortic aneurysm and aortic valve replacement.  The risks and benefits were discussed with the patient who agreed to proceed.  Hospital course: The patient was admitted electively on 06/05/2024 and taken to the operating room at which time underwent Biologic Bentall Procedure.  She tolerated the procedure without difficulty and was taken to the SICU in stable condition. The patient was extubated the  evening of surgery.  She required support with Epinephrine  and Cleviprex , both of which were weaned as hemodynamics allowed.  Her swan ganz catheter was removed without difficulty.  She was started on Lasix  to help facilitate diuresis.  She has responded well but will require some further as outpatient.  She was in NSR and her pacing wires were removed.  Her chest tubes were removed on 1/11, but blake drain was left in place.  This was later removed on postop day #2.Jane Conley  On postop  day #4 she was transferred to the 4 E. telemetry unit for further rehabilitation.  She has maintained sinus rhythm.  Renal function has remained within normal limits.  She did have a reactive postoperative leukocytosis but white blood cell count has returned to normal.  She has an expected acute blood loss anemia which is stable.  She had a reactive postoperative thrombocytopenia but platelet counts are returning to baseline with most recent value at 140 6K..  Incisions are healing well without evidence of infection.  She is tolerating diet.  She is tolerating gradually increasing activities using standard postcardiac surgical protocols.  Oxygen has been weaned and she maintains good saturations on room air.  She has had some hypertension and will be advised to resume lisinopril  based on home measurements.  She is currently on metoprolol  and blood pressure mostly in the good present.  Overall, at the time of discharge the patient was felt to be quite stable.  Consults: pulmonary/intensive care  Significant Diagnostic Studies:  DG Chest 2 View Result Date: 06/09/2024 EXAM: 2 VIEW(S) XRAY OF THE CHEST 06/09/2024 06:12:00 AM COMPARISON: 06/08/2024 CLINICAL HISTORY: Status post cardiac surgery. FINDINGS: LINES, TUBES AND DEVICES: Right IJ sheath removed. LUNGS AND PLEURA: Stable small to moderate right pleural effusion. Stable small left pleural effusion. Stable bibasilar hazy opacities. No pneumothorax. HEART AND MEDIASTINUM: Prosthetic aortic valve noted. Cardiomegaly, stable. No acute abnormality of the mediastinal silhouette. BONES AND SOFT TISSUES: Prior median sternotomy noted. No acute osseous abnormality. IMPRESSION: 1. Stable small to moderate right pleural effusion and small left pleural effusion. 2. Stable bibasilar hazy opacities, likely atelectasis. 3. Right IJ sheath and mediastinal drain removed. Electronically signed by: Waddell Calk MD MD 06/09/2024 08:33 AM EST RP Workstation: [id number]   DG  CHEST PORT 1 VIEW Result Date: 06/08/2024 CLINICAL DATA:  Status post cardiac surgery. EXAM: PORTABLE CHEST 1 VIEW COMPARISON:  Chest radiograph dated 06/07/2024. FINDINGS: Right IJ central venous line in similar position. Relatively similar bilateral pleural effusions and bibasilar atelectasis or infiltrate.No pneumothorax. Stable cardiac silhouette. No acute osseous pathology. IMPRESSION: No significant interval change. Electronically Signed   By: Vanetta Chou M.D.   On: 06/08/2024 19:09   DG Chest Port 1 View Result Date: 06/07/2024 CLINICAL DATA:  Pneumothorax. EXAM: PORTABLE CHEST 1 VIEW COMPARISON:  06/06/2024 and 05/01/2024. FINDINGS: Right IJ Swan-Ganz catheter has been removed with catheter sheath remaining in place. Mediastinal and chest drains are unchanged in position. Epicardial pacer wires in place. Ascending aortic repair and aortic valve replacement. Trachea is midline. Heart is enlarged, stable. Thoracic aorta is calcified. Small to moderate bilateral pleural effusions with bibasilar collapse/consolidation. No definite pneumothorax. IMPRESSION: 1. Small to moderate bilateral pleural effusions with bibasilar collapse/consolidation, likely atelectasis. 2. No pneumothorax. Electronically Signed   By: Newell Eke M.D.   On: 06/07/2024 10:12   DG Chest 2 View Result Date: 06/06/2024 CLINICAL DATA:  Preop. Aortic valve insufficiency. Thoracic aortic aneurysm without rupture. Aortic valve stenosis. EXAM: CHEST -  2 VIEW COMPARISON:  04/26/2024 FINDINGS: Mild cardiomegaly is stable.The cardiomediastinal contours are unchanged. The lungs are clear. Pulmonary vasculature is normal. No consolidation, pleural effusion, or pneumothorax. No acute osseous abnormalities are seen. IMPRESSION: Stable cardiomegaly. Electronically Signed   By: Andrea Gasman M.D.   On: 06/06/2024 12:55   DG Chest Port 1 View Result Date: 06/06/2024 CLINICAL DATA:  Status post aortic valve replacement. EXAM: PORTABLE  CHEST 1 VIEW COMPARISON:  06/05/2024 FINDINGS: Low volume film. The cardio pericardial silhouette is enlarged. Interval extubation and NG tube removal. Thoracic drains overlie the heart. Right IJ pulmonary artery catheter tip overlies the main pulmonary outflow tract, potentially just into the right main pulmonary artery. There is similar right base atelectasis with layering effusion. Interval improvement in aeration at the left base with persistent atelectasis in small effusion. No evidence for pneumothorax. Telemetry leads overlie the chest. IMPRESSION: 1. Interval extubation and NG tube removal. 2. Similar right base atelectasis with layering effusion. 3. Interval improvement in aeration at the left base with persistent atelectasis and small effusion. Electronically Signed   By: Camellia Candle M.D.   On: 06/06/2024 07:08   DG Chest Port 1 View Result Date: 06/05/2024 EXAM: 1 VIEW(S) XRAY OF THE CHEST 06/05/2024 04:06:00 PM COMPARISON: 04/26/2024 CLINICAL HISTORY: [id number] S/P AVR (aortic valve replacement) 759290; [id number] S/P thoracic aortic aneurysm repair [id number] FINDINGS: LINES, TUBES AND DEVICES: Endotracheal tube in place with tip 1.7 cm above the carina. Enteric tube enters stomach with tip not seen on this image. Right internal jugular Swan-Ganz catheter terminates over pulmonary outflow tract. Mediastinal drains in place. LUNGS AND PLEURA: Small bilateral pleural effusions. Bibasilar atelectasis. Mild prominence of parahilar interstitial markings, favoring mild pulmonary edema. No pneumothorax. HEART AND MEDIASTINUM: Stable cardiomediastinal silhouette with mild cardiomegaly. Aortic valve prosthesis placement. BONES AND SOFT TISSUES: Interval median sternotomy. IMPRESSION: 1. Endotracheal tube tip 1.7 cm above the carina. 2. Right internal jugular Swan-Ganz catheter tip projects over the pulmonary outflow tract, with mediastinal drains in place. 3. Small bilateral pleural effusions and bibasilar atelectasis  with mild perihilar interstitial prominence favoring mild pulmonary edema. Electronically signed by: Greig Pique MD MD 06/05/2024 04:24 PM EST RP Workstation: [id number]   ECHO INTRAOPERATIVE TEE Result Date: 06/05/2024  *INTRAOPERATIVE TRANSESOPHAGEAL REPORT *  Patient Name:   Jane Conley Date of Exam: 06/05/2024 Medical Rec #:  [id number]    Height:       67.5 in Accession #:    [id number]   Weight:       206.3 lb Date of Birth:  Oct 31, 1955    BSA:          2.06 m Patient Age:    68 years     BP:           132/63 mmHg Patient Gender: F            HR:           68 bpm. Exam Location:  Inpatient Transesophogeal exam was perform intraoperatively during surgical procedure. Patient was closely monitored under general anesthesia during the entirety of examination. Indications:     severe aortic regurgitation Performing Phys: Franky Bald MD Diagnosing Phys: Franky Bald MD Complications: No known complications during this procedure.                               POST-OP IMPRESSIONS  s/p AVR with 27 mm Inspiris Resilia aortic valve and ascending aneurysm  repair                        with 28mm x 10mm hemishield graft _ Left Ventricle: LVEF unchanged, CO and CI wnl. _ Right Ventricle: The right ventricle appears unchanged from pre-bypass. _ Aorta: s/p repair with 28mm x 10mm hemishield graft. No dissection s/p repair. _ Left Atrium: The left atrium appears unchanged from pre-bypass. _ Left Atrial Appendage: The left atrial appendage appears unchanged from pre-bypass. _ Aortic Valve: s/p replacement with 27mm Inspiris Resilia aortic valve, no peirvalvular leaks, rocking or bowing. Mean gradient of 3 mmHg. _ Mitral Valve: The mitral valve appears unchanged from pre-bypass. _ Tricuspid Valve: The tricuspid valve appears unchanged from pre-bypass. _ Pulmonic Valve: The pulmonic valve appears unchanged from pre-bypass. _ Interatrial Septum: The interatrial septum appears unchanged from pre-bypass. _ Interventricular Septum:  The interventricular septum appears unchanged from pre-bypass. _ Pericardium: The pericardium appears unchanged from pre-bypass. PRE-OP FINDINGS  Left Ventricle: The left ventricle has low normal systolic function, with an ejection fraction of 50-55%. The cavity size was severely dilated. Left ventricular diffuse hypokinesis with slightly increased depression of the inferior wall. There is severe  asymmetric left ventricular hypertrophy of the septal segment. Left ventricular diastolic function was not evaluated. Right Ventricle: The right ventricle has normal systolic function. The cavity was difficult to visualize due to the severely dilated left ventricle. There is no increase in right ventricular wall thickness. Catheter present in the right ventricle. There is no aneurysm seen. Left Atrium: Left atrial size was dilated. No left atrial/left atrial appendage thrombus was detected. The left atrial appendage is well visualized and there is no evidence of thrombus present. Right Atrium: Right atrial size was normal in size. Catheter present traversing the right atrium. Interatrial Septum: No atrial level shunt detected by color flow Doppler. There is no evidence of a patent foramen ovale. Pericardium: Trivial pericardial effusion is present. Mitral Valve: The mitral valve is normal in structure. Mitral valve regurgitation is trivial by color flow Doppler. The MR jet is centrally-directed. There is no evidence of mitral valve vegetation. There is no evidence of mitral stenosis. Tricuspid Valve: The tricuspid valve was normal in structure. Tricuspid valve regurgitation is trivial by color flow Doppler. The jet is directed centrally. No evidence of tricuspid stenosis is present. There is no evidence of tricuspid valve vegetation. Aortic Valve: The aortic valve is tricuspid but severely thickened and calcified. The left coronary cusp appears to be fixed in the open the open position resuling in severe aortic valve  regurgitation by color flow doppler. The jet is centrally-directed.  There is severe stenosis of the aortic valve secondary to the calcification with mean gradient of approx. . Pulmonic Valve: The pulmonic valve was normal in structure. Pulmonic valve regurgitation is not visualized by color flow Doppler. Aorta: The aortic arch are normal in size and structure. There is moderate dilatation of the ascending aorta, measuring 43-44 mm. Pulmonary Artery: Norva Purl catheter present on the right. The pulmonary artery is of normal size. Shunts: There is no evidence of an atrial septal defect. +--------------+--------++ LEFT VENTRICLE         +--------------+--------++ PLAX 2D                +--------------+--------++ LVIDd:        4.55 cm  +--------------+--------++ LVIDs:        2.95 cm  +--------------+--------++ LVOT diam:    2.50 cm  +--------------+--------++  LV SV:        61 ml    +--------------+--------++ LV SV Index:  28.62    +--------------+--------++ LVOT Area:    4.91 cm +--------------+--------++                        +--------------+--------++ +------------------------------+--------++ AORTIC VALVE (Pre-Replacement)         +------------------------------+--------++ AV Vmax:                      409 cm/s +------------------------------+--------++ AV Vmean:                     279 cm/s +------------------------------+--------++ AV VTI:                       0.535 m  +------------------------------+--------++ AV Peak Grad:                 67 mmHg  +------------------------------+--------++ AV Mean Grad:                 38 mmHg  +------------------------------+--------++ AR PHT:                       409 msec +------------------------------+--------++  +-------------+-------++ AORTA                +-------------+-------++ Ao Root diam:2.75 cm +-------------+-------++ Ao Asc diam: 4.30 cm +-------------+-------++   +--------------+-------+ SHUNTS                +--------------+-------+ Systemic Diam:2.50 cm +--------------+-------+  Franky Bald MD Electronically signed by Franky Bald MD Signature Date/Time: 06/05/2024/3:35:20 PM    Final    VAS US  CAROTID Result Date: 06/03/2024 Carotid Arterial Duplex Study Patient Name:  Jane Conley  Date of Exam:   06/03/2024 Medical Rec #: [id number]     Accession #:    [id number] Date of Birth: [birth year]     Patient Gender: F Patient Age:   63 years Exam Location:  [facility] Procedure:      VAS US  CAROTID Referring Phys: CON SU --------------------------------------------------------------------------------  Indications:       Aortic valve insufficiency and stenosis, thoracic aortic                    aneurysm without rupture. Risk Factors:      Hypertension. Comparison Study:  No prior exam. Performing Technologist: Edilia Elden Appl  Examination Guidelines: A complete evaluation includes B-mode imaging, spectral Doppler, color Doppler, and power Doppler as needed of all accessible portions of each vessel. Bilateral testing is considered an integral part of a complete examination. Limited examinations for reoccurring indications may be performed as noted.  Right Carotid Findings: +----------+--------+--------+--------+-------------------------+--------+           PSV cm/sEDV cm/sStenosisPlaque Description       Comments +----------+--------+--------+--------+-------------------------+--------+ CCA Prox  82      11                                                +----------+--------+--------+--------+-------------------------+--------+ CCA Distal56      8                                                 +----------+--------+--------+--------+-------------------------+--------+  ICA Prox  91      20              heterogenous and calcific         +----------+--------+--------+--------+-------------------------+--------+ ICA Mid   71      16                                                 +----------+--------+--------+--------+-------------------------+--------+ ICA Distal90      19                                                +----------+--------+--------+--------+-------------------------+--------+ ECA       73      11                                                +----------+--------+--------+--------+-------------------------+--------+ +----------+--------+-------+----------------+-------------------+           PSV cm/sEDV cmsDescribe        Arm Pressure (mmHG) +----------+--------+-------+----------------+-------------------+ [id number]             Multiphasic, WNL                    +----------+--------+-------+----------------+-------------------+ +---------+--------+--+--------+--+---------+ VertebralPSV cm/s41EDV cm/s11Antegrade +---------+--------+--+--------+--+---------+  Left Carotid Findings: +----------+--------+--------+--------+-------------------------+--------+           PSV cm/sEDV cm/sStenosisPlaque Description       Comments +----------+--------+--------+--------+-------------------------+--------+ CCA Prox  86      11                                                +----------+--------+--------+--------+-------------------------+--------+ CCA Distal55      8                                                 +----------+--------+--------+--------+-------------------------+--------+ ICA Prox  49      15              heterogenous and calcific         +----------+--------+--------+--------+-------------------------+--------+ ICA Mid   85      21                                                +----------+--------+--------+--------+-------------------------+--------+ ICA Distal91      22                                                +----------+--------+--------+--------+-------------------------+--------+ ECA       62      7                                                  +----------+--------+--------+--------+-------------------------+--------+ +----------+--------+--------+----------------+-------------------+  PSV cm/sEDV cm/sDescribe        Arm Pressure (mmHG) +----------+--------+--------+----------------+-------------------+ [id number]             Multiphasic, WNL                    +----------+--------+--------+----------------+-------------------+ +---------+--------+--+--------+-+---------+ VertebralPSV cm/s44EDV cm/s8Antegrade +---------+--------+--+--------+-+---------+   Summary: Right Carotid: Velocities in the right ICA are consistent with a 1-39% stenosis. Left Carotid: Velocities in the left ICA are consistent with a 1-39% stenosis. Vertebrals:  Bilateral vertebral arteries demonstrate antegrade flow. Subclavians: Normal flow hemodynamics were seen in bilateral subclavian              arteries. *See table(s) above for measurements and observations.  Electronically signed by Gaile New MD on 06/03/2024 at 7:09:28 PM.    Final     Results for orders placed or performed during the hospital encounter of 06/05/24 (from the past 48 hours)  CBC     Status: Abnormal   Collection Time: 06/09/24  3:00 AM  Result Value Ref Range   WBC 10.3 4.0 - 10.5 K/uL   RBC 3.13 (L) 3.87 - 5.11 MIL/uL   Hemoglobin 10.2 (L) 12.0 - 15.0 g/dL   HCT 70.0 (L) 63.9 - 53.9 %   MCV 95.5 80.0 - 100.0 fL   MCH 32.6 26.0 - 34.0 pg   MCHC 34.1 30.0 - 36.0 g/dL   RDW 87.4 88.4 - 84.4 %   Platelets 146 (L) 150 - 400 K/uL   nRBC 0.0 0.0 - 0.2 %    Comment: Performed at [facility], [address] [place], [place] [address]  Basic metabolic panel     Status: Abnormal   Collection Time: 06/09/24  3:00 AM  Result Value Ref Range   Sodium 139 135 - 145 mmol/L   Potassium 4.1 3.5 - 5.1 mmol/L   Chloride 104 98 - 111 mmol/L   CO2 29 22 - 32 mmol/L   Glucose, Bld 102 (H) 70 - 99 mg/dL    Comment: Glucose reference range applies  only to samples taken after fasting for at least 8 hours.   BUN 16 8 - 23 mg/dL   Creatinine, Ser 9.27 0.44 - 1.00 mg/dL   Calcium  8.8 (L) 8.9 - 10.3 mg/dL   GFR, Estimated >39 >39 mL/min    Comment: (NOTE) Calculated using the CKD-EPI Creatinine Equation (2021)    Anion gap 6 5 - 15    Comment: Performed at [facility], [address], [place], [place] [address]  Magnesium      Status: None   Collection Time: 06/09/24  3:00 AM  Result Value Ref Range   Magnesium  2.0 1.7 - 2.4 mg/dL    Comment: Performed at [facility], [address], [place], [place] [address]    Treatments: surgery:  CARDIOVASCULAR SURGERY OPERATIVE NOTE   06/05/2024   Surgeon:  Con Clunes, MD   First Assistant: Dorise Fellers, MD   Experienced assistance was necessary for this case due to surgical complexity. Dr. Fellers assisted by with retraction of delicate tissues, exposure, suctioning, and suture management throughout the entire operation.   Preoperative Diagnosis:  Severe AS, severe AI, ascending aortic aneurysm     Postoperative Diagnosis:  Same   Procedure:   Median Sternotomy Extracorporeal circulation 3.   Replacement of the ascending aorta (hemi-arch) using a 28 mm Hemashield graft under deep hypothermic circulatory arrest 4.   Aortic valve replacement    Discharge Exam:  Blood pressure (!) 148/91, pulse 76, temperature 97.9 F (36.6 C), temperature source Oral, resp. rate 20, height (P) 5' 7.5 (1.715 m), weight 94.4 kg, SpO2 99%.    General appearance: alert, cooperative, and no distress Heart: regular rate and rhythm and no murmur or rub Lungs: mildly dim in right base Abdomen: benign Extremities: minor Bil LE edema Wound: incis healing well  Discharge Medications:  The patient has been discharged on:   1.Beta Blocker:  Yes [   ]                              No   [email or web address]   ]                              If No, reason:  2.Ace Inhibitor/ARB: Yes [   ]                                      No  [   n ]                                     If No, reason:is written for use if BP elevated as outpatient  3.Statin:   Yes [   ]                  No  [ n  ]                  If No, reason:no CAD  4.ArleeneBETHA [place]  [ y  ]                  No   [   ]                  If No, reason:  Patient had ACS upon admission:n  Plavix/P2Y12 inhibitor: Yes [   ]                                      No  [ n  ]     Discharge Instructions     Amb Referral to Cardiac Rehabilitation   Complete by: As directed    Diagnosis: Valve Repair   Valve: Aortic   After initial evaluation and assessments completed: Virtual Based Care may be provided alone or in conjunction with Phase 2 Cardiac Rehab based on patient barriers.: Yes   Intensive Cardiac Rehabilitation (ICR) [facility] location only OR Traditional Cardiac Rehabilitation (TCR) *If criteria for ICR are not met will enroll in TCR ([facility] only): Yes      Allergies as of 06/10/2024       Reactions   Codeine Nausea And Vomiting, Swelling        Medication List     STOP taking these medications    carvedilol  3.125 MG tablet Commonly known as: COREG    empagliflozin  10 MG Tabs tablet Commonly known as: JARDIANCE    ibuprofen 200 MG tablet Commonly known as: ADVIL       TAKE these medications    albuterol 108 (90 Base) MCG/ACT inhaler Commonly known as: VENTOLIN HFA Inhale 2 puffs  into the lungs every 6 (six) hours as needed for shortness of breath or wheezing.   aspirin  EC 325 MG tablet Take 1 tablet (325 mg total) by mouth daily. Start taking on: June 11, 2024   B-12 PO Take 1 tablet by mouth daily.   cholecalciferol 10 MCG (400 UNIT) Tabs tablet Commonly known as: VITAMIN D3 Take 400 Units by mouth in the morning.   Co Q 10 100 MG Caps Take 100 mg by mouth in the morning.   cyanocobalamin 1000 MCG tablet Take 1,000 mcg by mouth in the morning.   furosemide  20 MG tablet Commonly known as: LASIX  Take 2  tablets (40 mg total) by mouth daily. After 3 days decrease to 20 mg and you should only take on days where your weight is up 3 pounds Start taking on: June 11, 2024 What changed:  how much to take additional instructions   lisinopril  10 MG tablet Commonly known as: ZESTRIL  Take 1 tablet (10 mg total) by mouth daily. Only take if BP is over 140 systolic What changed: additional instructions   MAGNESIUM  GLYCINATE ADVANCED PO Take 200 mg by mouth 2 (two) times daily after a meal.   metoprolol  tartrate 25 MG tablet Commonly known as: LOPRESSOR  Take 1 tablet (25 mg total) by mouth 2 (two) times daily.   Olopatadine HCl 0.2 % Soln Place 1 drop into both eyes daily as needed (allergies, eye irritations).   oxyCODONE  5 MG immediate release tablet Commonly known as: Oxy IR/ROXICODONE  Take 1 tablet (5 mg total) by mouth every 6 (six) hours as needed for severe pain (pain score 7-10).   potassium chloride  SA 20 MEQ tablet Commonly known as: KLOR-CON  M Take 1 tablet (20 mEq total) by mouth daily. Only take on days that you take lasix  Start taking on: June 11, 2024   vitamin C 1000 MG tablet Take 1,000 mg by mouth daily.   zinc gluconate 50 MG tablet Take 50 mg by mouth in the morning.        Follow-up Information     Rutha Manuelita HERO, PA-C Follow up.   Specialties: Physician Assistant, Thoracic Surgery Why: Appointment to see the physician assistant at Dr. Linward office on 04/18/2025 at 11 AM. Contact information: [address], [place] [address] [phone]         Anner Alm ORN, MD Follow up.   Specialty: Cardiology Why: Please see discharge paperwork for details of follow-up appointment with cardiology.  Your first visit may be with a nurse practitioner or physician assistant. Contact information: [address] [place] [address] [phone]         Shona Norleen PEDLAR, MD Follow up.   Specialty: Internal Medicine Why: Please  schedule an appointment with your primary care provider in 1 to 2 weeks. Contact information: [address] [place] [address] [phone]                 Signed:  Lemond FORBES Cera, PA-C  06/10/2024, 1:12 PM  "

## 2024-06-08 NOTE — Plan of Care (Signed)

## 2024-06-08 NOTE — Progress Notes (Signed)
 "  NAME:  Jane Conley, MRN:  [id number], DOB:  Oct 04, 1955, LOS: 3 ADMISSION DATE:  06/05/2024, CONSULTATION DATE:  06/06/2023 REFERRING MD:  Dr. Daniel, CHIEF COMPLAINT:    History of Present Illness:  [name] is a 69 year old female with HTN, moderate LVH, and severe aortic stenosis/aortic insufficiency and ascending aortic aneurysm who presents for surgical repair. She is now s/p biologic AVR and ascending aortic and hemiarch repair on 06/06/2023 with Dr. Daniel.   Pre-op Echo with EF 45%; post-op TEE with normal biventricular function  Intra-op received 760 mL cell saver, 2 plt, 1 FFP, 2800 mL crystalloid, and 500 albumin . EBL 1180 mL. UOP 1875 mL.   CPB: 199; aortic cross clamp: 37; circ arrest~27  Pertinent  Medical History  Per above  Significant Hospital Events: Including procedures, antibiotic start and stop dates in addition to other pertinent events   06/06/2023 OR s/p biologic AVR, ascending aorta and hemiarch repair. To ICU post-op intubated on 2 epi and low dose levophed   1/10 inotropes discontinued, lines removed  Interim History / Subjective:  Still poor appetite. Nausea better today. No BM yet.  Objective    Blood pressure 123/70, pulse (!) 59, temperature 97.8 F (36.6 C), temperature source Oral, resp. rate 20, height (P) 5' 7.5 (1.715 m), weight 96.8 kg, SpO2 98%.        Intake/Output Summary (Last 24 hours) at 06/08/2024 0643 Last data filed at 06/08/2024 0200 Gross per 24 hour  Intake 100 ml  Output 1400 ml  Net -1300 ml   Filed Weights   06/06/24 0500 06/07/24 0500 06/08/24 0423  Weight: 99.6 kg 97.3 kg 96.8 kg    Examination: General: elderly woman sitting up in the chair in NAD HENT: [place]/AT, eyes anicteric Lungs: breathing comfortably on RA, mild rhales in bases, clear anteriorly. Cardiovascular: S1S2, RRR Abdomen: + bowel sounds, soft, NT Extremities: improving LE edema Neuro: awake & alert, able to sit up easily  Chest tube output: 40cc overnight.    BUN 120 Cr 0.63 WBC 11.6 H/H 9.7/29.5 Platelets 122     Resolved problem list   Assessment and Plan   Severe AS & AI s/p biologic AVR Ascending aortic aneurysm s/p ascending and hemiarch repair Anticipated post-op cardioplegia, vasoplegia - post-op management per TCTS. Pulling remaining chest tubes today. -d/c RIJ CVC -aspirin  daily -lasix  40mg  -con't working on diet & mobility -metoprolol  25mg  BID -pain control per protocol  Constipation -sorbitol , suppository -mobility  Nausea- better -con't reglan  -zofran  PRN  R pleural effusion -diuresis  Thrombocytopenia post bypass -monitor, no need for transfusion  Stress hyperglycemia; A1c 5.4 -hasn't needed insulin  past 24 hrs  H/o HTN -con't holding PTA coreg  and lisinopril   Obesity, Body mass index is 32.93 kg/m (pended). -long term recommend weight loss  DVT: lovenox   Labs   CBC: Recent Labs  Lab 06/05/24 1555 06/05/24 2156 06/06/24 0541 06/07/24 0500 06/08/24 0418  WBC 26.2* 11.3* 17.3* 15.9* 11.6*  HGB 11.0* 10.4* 10.6* 9.8* 9.7*  HCT 31.7* 29.8* 30.4* 28.8* 29.5*  MCV 94.1 92.8 92.7 96.0 97.7  PLT 140* 104* 127* 94* 122*    Basic Metabolic Panel: Recent Labs  Lab 06/05/24 1552 06/05/24 2156 06/06/24 0541 06/06/24 1850 06/07/24 0500 06/08/24 0418  NA 142 141 139  --  137 139  K 3.7 4.0 3.8  --  4.2 4.0  CL 109 108 107  --  104 104  CO2 23 24 23   --  26 26  GLUCOSE  133* 138* 147*  --  110* 99  BUN 10 11 10   --  12 10  CREATININE 0.86 0.80 0.76  --  0.73 0.63  CALCIUM  7.2* 7.8* 7.9*  --  8.3* 8.4*  MG  --   --  2.6* 2.4  --   --    GFR: Estimated Creatinine Clearance: 81.2 mL/min (by C-G formula based on SCr of 0.63 mg/dL). Recent Labs  Lab 06/05/24 2156 06/06/24 0541 06/07/24 0500 06/08/24 0418  WBC 11.3* 17.3* 15.9* 11.6*    Liver Function Tests: Recent Labs  Lab 06/03/24 1130  AST 21  ALT 18  ALKPHOS 87  BILITOT 0.7  PROT 7.7  ALBUMIN  4.7     Critical care  time:       Leita SHAUNNA Gaskins, DO 06/08/2024 7:30 AM [facility]  For contact information, see Amion. If no response to pager, please call PCCM 2H APP. After hours, 7PM- 7AM, please call on call APP for 2H.    "

## 2024-06-08 NOTE — Discharge Instructions (Signed)
Discharge Instructions:  1. You may shower, please wash incisions daily with soap and water and keep dry.  If you wish to cover wounds with dressing you may do so but please keep clean and change daily.  No tub baths or swimming until incisions have completely healed.  If your incisions become red or develop any drainage please call our office at [phone]  2. No Driving until cleared by surgeon office and you are no longer using narcotic pain medications  3. Monitor your weight daily.. Please use the same scale and weigh at same time... If you gain 5-10 lbs in 48 hours with associated lower extremity swelling, please contact our office at [phone]  4. Fever of 101.5 for at least 24 hours with no source, please contact our office at [phone]  5. Activity- up as tolerated, please walk at least 3 times per day.  Avoid strenuous activity, no lifting, pushing, or pulling with your arms over 8-10 lbs for a minimum of 6 weeks  6. If any questions or concerns arise, please do not hesitate to contact our office at [phone] 

## 2024-06-09 ENCOUNTER — Inpatient Hospital Stay (HOSPITAL_COMMUNITY)

## 2024-06-09 ENCOUNTER — Other Ambulatory Visit: Payer: Self-pay

## 2024-06-09 ENCOUNTER — Other Ambulatory Visit (HOSPITAL_COMMUNITY): Payer: Self-pay

## 2024-06-09 DIAGNOSIS — I7121 Aneurysm of the ascending aorta, without rupture: Secondary | ICD-10-CM | POA: Diagnosis not present

## 2024-06-09 DIAGNOSIS — J90 Pleural effusion, not elsewhere classified: Secondary | ICD-10-CM | POA: Diagnosis not present

## 2024-06-09 DIAGNOSIS — I10 Essential (primary) hypertension: Secondary | ICD-10-CM | POA: Diagnosis not present

## 2024-06-09 DIAGNOSIS — I350 Nonrheumatic aortic (valve) stenosis: Secondary | ICD-10-CM | POA: Diagnosis not present

## 2024-06-09 LAB — TYPE AND SCREEN
ABO/RH(D): A POS
Antibody Screen: NEGATIVE
Unit division: 0
Unit division: 0
Unit division: 0
Unit division: 0

## 2024-06-09 LAB — BPAM RBC
Blood Product Expiration Date: 202601262359
Blood Product Expiration Date: 202601262359
Blood Product Expiration Date: 202601262359
Blood Product Expiration Date: 202601272359
ISSUE DATE / TIME: 202601071718
ISSUE DATE / TIME: 202601071718
ISSUE DATE / TIME: 202601091006
ISSUE DATE / TIME: 202601091006
Unit Type and Rh: 6200
Unit Type and Rh: 6200
Unit Type and Rh: 6200
Unit Type and Rh: 6200

## 2024-06-09 LAB — CBC
HCT: 29.9 % — ABNORMAL LOW (ref 36.0–46.0)
Hemoglobin: 10.2 g/dL — ABNORMAL LOW (ref 12.0–15.0)
MCH: 32.6 pg (ref 26.0–34.0)
MCHC: 34.1 g/dL (ref 30.0–36.0)
MCV: 95.5 fL (ref 80.0–100.0)
Platelets: 146 K/uL — ABNORMAL LOW (ref 150–400)
RBC: 3.13 MIL/uL — ABNORMAL LOW (ref 3.87–5.11)
RDW: 12.5 % (ref 11.5–15.5)
WBC: 10.3 K/uL (ref 4.0–10.5)
nRBC: 0 % (ref 0.0–0.2)

## 2024-06-09 LAB — BASIC METABOLIC PANEL WITH GFR
Anion gap: 6 (ref 5–15)
BUN: 16 mg/dL (ref 8–23)
CO2: 29 mmol/L (ref 22–32)
Calcium: 8.8 mg/dL — ABNORMAL LOW (ref 8.9–10.3)
Chloride: 104 mmol/L (ref 98–111)
Creatinine, Ser: 0.72 mg/dL (ref 0.44–1.00)
GFR, Estimated: >60 mL/min
Glucose, Bld: 102 mg/dL — ABNORMAL HIGH (ref 70–99)
Potassium: 4.1 mmol/L (ref 3.5–5.1)
Sodium: 139 mmol/L (ref 135–145)

## 2024-06-09 LAB — SURGICAL PATHOLOGY

## 2024-06-09 LAB — MAGNESIUM: Magnesium: 2 mg/dL (ref 1.7–2.4)

## 2024-06-09 MED ORDER — FUROSEMIDE 10 MG/ML IJ SOLN
40.0000 mg | Freq: Once | INTRAMUSCULAR | Status: AC
  Administered 2024-06-09: 40 mg via INTRAVENOUS
  Filled 2024-06-09: qty 4

## 2024-06-09 MED ORDER — SENNOSIDES-DOCUSATE SODIUM 8.6-50 MG PO TABS: 2.0000 | ORAL_TABLET | Freq: Every evening | ORAL | Status: DC | PRN

## 2024-06-09 MED ORDER — FUROSEMIDE 40 MG PO TABS
40.0000 mg | ORAL_TABLET | Freq: Every day | ORAL | Status: DC
  Administered 2024-06-10: 40 mg via ORAL
  Filled 2024-06-09: qty 1

## 2024-06-09 NOTE — Progress Notes (Signed)
 Patient arrived in the unit, CCMD notified, V/S obtained, all needs met, call bell in reach.   06/09/24 0832  Vitals  Temp 98.4 F (36.9 C)  Temp Source Oral  BP (!) 84/64  MAP (mmHg) 70  BP Location Left Arm  BP Method Automatic  Patient Position (if appropriate) Lying  Pulse Rate 83  Pulse Rate Source Monitor  ECG Heart Rate 81  Resp 20  Level of Consciousness  Level of Consciousness Alert  MEWS COLOR  MEWS Score Color Green  Oxygen Therapy  SpO2 97 %  O2 Device Room Air  Pain Assessment  Pain Scale 0-10  Pain Score 0  MEWS Score  MEWS Temp 0  MEWS Systolic 1  MEWS Pulse 0  MEWS RR 0  MEWS LOC 0  MEWS Score 1

## 2024-06-09 NOTE — Progress Notes (Signed)
 4 Days Post-Op Procedures (LRB): REPLACEMENT, AORTIC VALVE, OPEN UTILIZING INSPIRIS RESILIA  AORTIC VALVE (N/A) REPLACEMENT, AORTA, ASCENDING UTILIZING 28 X 10 MM HEMASHIELD PLATINUM GRAFT (N/A) ECHOCARDIOGRAM, TRANSESOPHAGEAL, INTRAOPERATIVE (N/A) Subjective: Lots of BM yesterday after sorbitol  Feels well today, already walked this morning Remains in NSR Still awaiting 4E bed  Objective: Vital signs in last 24 hours: BP (!) 147/79   Pulse 70   Temp 98.1 F (36.7 C) (Oral)   Resp 18   Ht (P) 5' 7.5 (1.715 m)   Wt 95.7 kg   SpO2 100%   BMI (P) 32.56 kg/m  Filed Weights   06/07/24 0500 06/08/24 0423 06/09/24 0500  Weight: 97.3 kg 96.8 kg 95.7 kg    Hemodynamic parameters for last 24 hours:    Intake/Output from previous day: 01/12 0701 - 01/13 0700 In: 600 [P.O.:600] Out: -  Intake/Output this shift: No intake/output data recorded.  Physical Exam: General - Resting comfortably in chair CV - RRR Resp - Unlabored on RA Abd - Soft, ND/NT Ext - Moderate edema  Lab Results:    Latest Ref Rng & Units 06/09/2024    3:00 AM 06/08/2024    4:18 AM 06/07/2024    5:00 AM  CBC  WBC 4.0 - 10.5 K/uL 10.3  11.6  15.9   Hemoglobin 12.0 - 15.0 g/dL 89.7  9.7  9.8   Hematocrit 36.0 - 46.0 % 29.9  29.5  28.8   Platelets 150 - 400 K/uL 146  122  94       Latest Ref Rng & Units 06/09/2024    3:00 AM 06/08/2024    4:18 AM 06/07/2024    5:00 AM  CMP  Glucose 70 - 99 mg/dL 897  99  889   BUN 8 - 23 mg/dL 16  10  12    Creatinine 0.44 - 1.00 mg/dL 9.27  9.36  9.26   Sodium 135 - 145 mmol/L 139  139  137   Potassium 3.5 - 5.1 mmol/L 4.1  4.0  4.2   Chloride 98 - 111 mmol/L 104  104  104   CO2 22 - 32 mmol/L 29  26  26    Calcium  8.9 - 10.3 mg/dL 8.8  8.4  8.3     CXR: Right   Assessment/Plan: S/P Procedures (LRB): REPLACEMENT, AORTIC VALVE, OPEN UTILIZING INSPIRIS RESILIA  AORTIC VALVE (N/A) REPLACEMENT, AORTA, ASCENDING UTILIZING 28 X 10 MM HEMASHIELD PLATINUM  GRAFT (N/A) ECHOCARDIOGRAM, TRANSESOPHAGEAL, INTRAOPERATIVE (N/A) POD4 s/p bioAVR/ascending and hemiarch replacement NEURO- intact  Pain control PRN CV- in SR around 60-80 bpm             Tolerating metop 25 BID RESP- Saturating well on RA              Continue IS, pulm hygiene, ambulation RENAL- creatinine and lytes Ok  Weight up 4 lbs from pre-op             IV lasix  40 x 1 today given right pleural effusion GI- tolerating diet  BM:  1/12 - PRN bowel regimen Endo- BG well controlled ID- NAI DVT ppx - SCD + Lovenox   Dispo: Still awaiting 4E today   LOS: 4 days    Con RAMAN Aubrei Bouchie 06/09/2024

## 2024-06-09 NOTE — Progress Notes (Signed)
 "  NAME:  Jane Conley, MRN:  [id number], DOB:  [birth year], LOS: 4 ADMISSION DATE:  06/05/2024, CONSULTATION DATE:  06/06/2023 REFERRING MD:  Dr. Daniel, CHIEF COMPLAINT:    History of Present Illness:  Jane Conley is a 69 year old female with HTN, moderate LVH, and severe aortic stenosis/aortic insufficiency and ascending aortic aneurysm who presents for surgical repair. She is now s/p biologic AVR and ascending aortic and hemiarch repair on 06/06/2023 with Dr. Daniel.   Pre-op Echo with EF 45%; post-op TEE with normal biventricular function  Intra-op received 760 mL cell saver, 2 plt, 1 FFP, 2800 mL crystalloid, and 500 albumin . EBL 1180 mL. UOP 1875 mL.   CPB: 199; aortic cross clamp: 37; circ arrest~27  Pertinent  Medical History  Per above  Significant Hospital Events: Including procedures, antibiotic start and stop dates in addition to other pertinent events   06/06/2023 OR s/p biologic AVR, ascending aorta and hemiarch repair. To ICU post-op intubated on 2 epi and low dose levophed   1/10 inotropes discontinued, lines removed  Interim History / Subjective:  Coughing some since surgery, breathing is stable. Eating ok.   Objective    Blood pressure 119/66, pulse 88, temperature 98.4 F (36.9 C), temperature source Oral, resp. rate 20, height (P) 5' 7.5 (1.715 m), weight 95.7 kg, SpO2 97%.        Intake/Output Summary (Last 24 hours) at 06/09/2024 0933 Last data filed at 06/09/2024 0856 Gross per 24 hour  Intake 481 ml  Output --  Net 481 ml   Filed Weights   06/07/24 0500 06/08/24 0423 06/09/24 0500  Weight: 97.3 kg 96.8 kg 95.7 kg    Examination: General: elderly woman sitting up in the chair in NAD HENT: [place]/AT, eyes anicteric Lungs: breathing comfortably on RA, reduced R basilar breath sounds, rhales L base Cardiovascular: S1S2, RRR Abdomen: soft, NT Extremities  pedal edema Neuro: awake, alert, moving all extremities  BUN 16 Cr 0.72 WBC 10.3 H/H 10.2/29.9 Platelets 146    CXR personally reviewed> moderate R effusion, mild insterstitial edema   Resolved problem list  Nausea, resolved  Stress hyperglycemia; A1c 5.4- resolved  Assessment and Plan   Severe AS & AI s/p biologic AVR Ascending aortic aneurysm s/p ascending and hemiarch repair Anticipated post-op cardioplegia, vasoplegia - post-op care per TCTS -aspirin , metoprolol  -con't diuresis -metoprolol  25m5 BID -tele monitoring -pain control as needed- tramadol , oxy  Constipation; resolved -maintenance bowel regimen-- senna PRN   R pleural effusion -con't diuresis  Thrombocytopenia post bypass; recovering -monitor, no need for transfusion  H/o HTN -holding PTA lisinopril  and coreg   Obesity, Body mass index is 32.56 kg/m (pended). -long term recommend weight loss  DVT: lovenox   Transferring to the floor today. PCCM will be available as needed.  Labs   CBC: Recent Labs  Lab 06/05/24 2156 06/06/24 0541 06/07/24 0500 06/08/24 0418 06/09/24 0300  WBC 11.3* 17.3* 15.9* 11.6* 10.3  HGB 10.4* 10.6* 9.8* 9.7* 10.2*  HCT 29.8* 30.4* 28.8* 29.5* 29.9*  MCV 92.8 92.7 96.0 97.7 95.5  PLT 104* 127* 94* 122* 146*    Basic Metabolic Panel: Recent Labs  Lab 06/05/24 2156 06/06/24 0541 06/06/24 1850 06/07/24 0500 06/08/24 0418 06/09/24 0300  NA 141 139  --  137 139 139  K 4.0 3.8  --  4.2 4.0 4.1  CL 108 107  --  104 104 104  CO2 24 23  --  26 26 29   GLUCOSE 138* 147*  --  110*  99 102*  BUN 11 10  --  12 10 16   CREATININE 0.80 0.76  --  0.73 0.63 0.72  CALCIUM  7.8* 7.9*  --  8.3* 8.4* 8.8*  MG  --  2.6* 2.4  --   --  2.0   GFR: Estimated Creatinine Clearance: 80.8 mL/min (by C-G formula based on SCr of 0.72 mg/dL). Recent Labs  Lab 06/06/24 0541 06/07/24 0500 06/08/24 0418 06/09/24 0300  WBC 17.3* 15.9* 11.6* 10.3    Liver Function Tests: Recent Labs  Lab 06/03/24 1130  AST 21  ALT 18  ALKPHOS 87  BILITOT 0.7  PROT 7.7  ALBUMIN  4.7     Critical care  time:       Jane SHAUNNA Gaskins, DO 06/09/2024 9:33 AM [facility]  For contact information, see Amion. If no response to pager, please call PCCM 2H APP. After hours, 7PM- 7AM, please call on call APP for 2H.    "

## 2024-06-09 NOTE — Progress Notes (Signed)
 CARDIAC REHAB PHASE I    Post OHS education including site care, restrictions, heart healthy diet, sternal precautions, IS use at home, home needs at discharge, exercise guidelines, and CRP2 reviewed. All questions and concerns addressed. Will refer to AP for CRP2. At this time, ambulation deferred due to patient previously ambulated in hallway independently. Will continue to follow for any ambulatory needs if neccessary.   [id number] Jane JAYSON [place], RN BSN  06/09/2024 9:34 AM

## 2024-06-10 ENCOUNTER — Other Ambulatory Visit (HOSPITAL_COMMUNITY): Payer: Self-pay

## 2024-06-10 MED ORDER — METOPROLOL TARTRATE 25 MG PO TABS
25.0000 mg | ORAL_TABLET | Freq: Two times a day (BID) | ORAL | 1 refills | Status: DC
  Filled 2024-06-10: qty 60, 30d supply, fill #0

## 2024-06-10 MED ORDER — LISINOPRIL 10 MG PO TABS
10.0000 mg | ORAL_TABLET | Freq: Every day | ORAL | 0 refills | Status: DC
  Filled 2024-06-10: qty 30, 30d supply, fill #0

## 2024-06-10 MED ORDER — POTASSIUM CHLORIDE CRYS ER 20 MEQ PO TBCR
20.0000 meq | EXTENDED_RELEASE_TABLET | Freq: Every day | ORAL | 0 refills | Status: AC
  Filled 2024-06-10: qty 30, 30d supply, fill #0

## 2024-06-10 MED ORDER — ASPIRIN 325 MG PO TBEC: 325.0000 mg | DELAYED_RELEASE_TABLET | Freq: Every day | ORAL | Status: AC

## 2024-06-10 MED ORDER — FUROSEMIDE 20 MG PO TABS
40.0000 mg | ORAL_TABLET | Freq: Every day | ORAL | 0 refills | Status: AC
  Filled 2024-06-10: qty 33, 16d supply, fill #0

## 2024-06-10 MED ORDER — OXYCODONE HCL 5 MG PO TABS
5.0000 mg | ORAL_TABLET | Freq: Four times a day (QID) | ORAL | 0 refills | Status: DC | PRN
  Filled 2024-06-10: qty 28, 7d supply, fill #0

## 2024-06-10 NOTE — TOC Transition Note (Signed)
 Transition of Care (TOC) - Discharge Note Rayfield Gobble RN, BSN Inpatient Care Management Unit 4E- RN Case Manager See Treatment Team for direct phone #   Patient Details  Name: Jane Conley MRN: [id number] Date of Birth: Dec 27, 1955  Transition of Care [facility]) CM/SW Contact:  Gobble Rayfield Hurst, RN Phone Number: 06/10/2024, 10:15 AM   Clinical Narrative:    Pt stable for transition home today, no HH or DME needs noted. Family to transport home.   CM did receive notice from [facility] liaison that TCTS office made pre-op referral for any HH needs- liaison to follow up with pt.   No ICM needs noted for discharge   Final next level of care: Home/Self Care Barriers to Discharge: No Barriers Identified   Patient Goals and CMS Choice Patient states their goals for this hospitalization and ongoing recovery are:: return home   Choice offered to / list presented to : NA      Discharge Placement               Home        Discharge Plan and Services Additional resources added to the After Visit Summary for   In-house Referral: NA Discharge Planning Services: NA Post Acute Care Choice: NA          DME Arranged: N/A DME Agency: NA       HH Arranged: NA HH Agency: [facility]     Representative spoke with at [facility] Agency: [place]  Social Drivers of Health (SDOH) Interventions SDOH Screenings   Food Insecurity: No Food Insecurity (06/09/2024)  Housing: Low Risk (06/09/2024)  Transportation Needs: No Transportation Needs (06/09/2024)  Utilities: Not At Risk (06/09/2024)  Depression (PHQ2-9): Low Risk (04/30/2024)  Social Connections: Moderately Integrated (06/09/2024)  Tobacco Use: Low Risk (06/04/2024)     Readmission Risk Interventions    06/10/2024   10:15 AM 04/27/2024    3:59 PM  Readmission Risk Prevention Plan  Post Dischage Appt Complete Complete  Medication Screening Complete Complete  Transportation Screening Complete Complete

## 2024-06-10 NOTE — Progress Notes (Signed)
 Patient had Pravena wound vac, which has been removed as per the order, patient had some small blisters close to her incision sites informed to the PA, incision sites cleansed, educated regarding wound care,. AVS documents provided to the patient and made aware regarding follow up appointments and TOC medications, PIV removed, CCMD notified, patient will pick up TOC medication on the way out, husband to provide transportation, had no any concerns during d/c.

## 2024-06-10 NOTE — Progress Notes (Addendum)
 5 Days Post-Op Procedures (LRB): REPLACEMENT, AORTIC VALVE, OPEN UTILIZING INSPIRIS RESILIA  AORTIC VALVE (N/A) REPLACEMENT, AORTA, ASCENDING UTILIZING 28 X 10 MM HEMASHIELD PLATINUM GRAFT (N/A) ECHOCARDIOGRAM, TRANSESOPHAGEAL, INTRAOPERATIVE (N/A) Subjective: Feels well, no specific c/o  Objective: Vital signs in last 24 hours: Temp:  [98.1 F (36.7 C)-98.9 F (37.2 C)] 98.1 F (36.7 C) (01/14 0336) Pulse Rate:  [69-97] 79 (01/14 0336) Cardiac Rhythm: Normal sinus rhythm (01/13 2100) Resp:  [18-26] 20 (01/14 0336) BP: (84-168)/(64-99) 168/90 (01/14 0336) SpO2:  [95 %-100 %] 100 % (01/14 0336) Weight:  [94.4 kg] 94.4 kg (01/14 0336)  Hemodynamic parameters for last 24 hours:    Intake/Output from previous day: 01/13 0701 - 01/14 0700 In: 241 [P.O.:241] Out: -  Intake/Output this shift: No intake/output data recorded.  General appearance: alert, cooperative, and no distress Heart: regular rate and rhythm and no murmur or rub Lungs: mildly dim in right base Abdomen: benign Extremities: minor Bil LE edema Wound: provena in place  Lab Results: Recent Labs    06/08/24 0418 06/09/24 0300  WBC 11.6* 10.3  HGB 9.7* 10.2*  HCT 29.5* 29.9*  PLT 122* 146*   BMET:  Recent Labs    06/08/24 0418 06/09/24 0300  NA 139 139  K 4.0 4.1  CL 104 104  CO2 26 29  GLUCOSE 99 102*  BUN 10 16  CREATININE 0.63 0.72  CALCIUM  8.4* 8.8*    PT/INR: No results for input(s): LABPROT, INR in the last 72 hours. ABG    Component Value Date/Time   PHART 7.317 (L) 06/05/2024 1548   HCO3 22.9 06/05/2024 1548   TCO2 24 06/05/2024 1548   ACIDBASEDEF 3.0 (H) 06/05/2024 1548   O2SAT 64.7 06/05/2024 1551   CBG (last 3)  Recent Labs    06/07/24 2115 06/08/24 0330 06/08/24 0620  GLUCAP 95 88 96    Meds Scheduled Meds:  acetaminophen   1,000 mg Oral Q6H   aspirin  EC  325 mg Oral Daily   Chlorhexidine  Gluconate Cloth  6 each Topical Daily   enoxaparin  (LOVENOX ) injection   40 mg Subcutaneous QHS   furosemide   40 mg Oral Daily   metoprolol  tartrate  25 mg Oral BID   potassium chloride   40 mEq Oral Daily   sodium chloride  flush  3 mL Intravenous Q12H   sodium chloride  flush  3 mL Intravenous Q12H   Continuous Infusions: PRN Meds:.metoprolol  tartrate, morphine  injection, ondansetron  (ZOFRAN ) IV, mouth rinse, oxyCODONE , senna-docusate, sodium chloride  flush, sodium chloride  flush, traMADol   Xrays DG Chest 2 View Result Date: 06/09/2024 EXAM: 2 VIEW(S) XRAY OF THE CHEST 06/09/2024 06:12:00 AM COMPARISON: 06/08/2024 CLINICAL HISTORY: Status post cardiac surgery. FINDINGS: LINES, TUBES AND DEVICES: Right IJ sheath removed. LUNGS AND PLEURA: Stable small to moderate right pleural effusion. Stable small left pleural effusion. Stable bibasilar hazy opacities. No pneumothorax. HEART AND MEDIASTINUM: Prosthetic aortic valve noted. Cardiomegaly, stable. No acute abnormality of the mediastinal silhouette. BONES AND SOFT TISSUES: Prior median sternotomy noted. No acute osseous abnormality. IMPRESSION: 1. Stable small to moderate right pleural effusion and small left pleural effusion. 2. Stable bibasilar hazy opacities, likely atelectasis. 3. Right IJ sheath and mediastinal drain removed. Electronically signed by: Waddell Calk MD MD 06/09/2024 08:33 AM EST RP Workstation: [id number]   DG CHEST PORT 1 VIEW Result Date: 06/08/2024 CLINICAL DATA:  Status post cardiac surgery. EXAM: PORTABLE CHEST 1 VIEW COMPARISON:  Chest radiograph dated 06/07/2024. FINDINGS: Right IJ central venous line in similar position. Relatively similar bilateral  pleural effusions and bibasilar atelectasis or infiltrate.No pneumothorax. Stable cardiac silhouette. No acute osseous pathology. IMPRESSION: No significant interval change. Electronically Signed   By: Vanetta Chou M.D.   On: 06/08/2024 19:09    Assessment/Plan: S/P Procedures (LRB): REPLACEMENT, AORTIC VALVE, OPEN UTILIZING INSPIRIS  RESILIA  AORTIC VALVE (N/A) REPLACEMENT, AORTA, ASCENDING UTILIZING 28 X 10 MM HEMASHIELD PLATINUM GRAFT (N/A) ECHOCARDIOGRAM, TRANSESOPHAGEAL, INTRAOPERATIVE (N/A) POD#5 bioAVR/ascending and hemiarch repair  1 afeb, s BP quite variable 84-160's range, NSR, some PVC'son metoprolol  25 po BID 2 O2 sats good on RA 3 voiding- amts not recorded, weight slightly above preop- currently on lasix  po daily- will cont w/ right p effus and some LE edema 4 no new labs or xrays 5 will d/w MD poss home today or tomorrow    LOS: 5 days    Lemond FORBES Cera PA-C Pager [phone] 06/10/2024  Looks great this morning. BP has been variable but mostly high.  Weight is down and edema is better.  Plan for home today with metoprolol . Advised patient to resume home lisinopril  if SBP > 140s consistently.    Con Clunes, MD Cardiothoracic Surgery Pager: [phone]

## 2024-06-10 NOTE — Care Management Important Message (Signed)
 Important Message  Patient Details  Name: Jane Conley MRN: [id number] Date of Birth: 06-08-1955   Important Message Given:  Yes - Medicare IM     Vonzell Arrie Sharps 06/10/2024, 10:53 AM

## 2024-06-11 ENCOUNTER — Telehealth: Payer: Self-pay

## 2024-06-11 ENCOUNTER — Other Ambulatory Visit: Payer: Self-pay | Admitting: Cardiology

## 2024-06-11 DIAGNOSIS — Z952 Presence of prosthetic heart valve: Secondary | ICD-10-CM

## 2024-06-11 NOTE — Progress Notes (Signed)
 Post op echo ordered

## 2024-06-11 NOTE — Addendum Note (Signed)
 Addended by: HENRY SHAVER B on: 06/11/2024 03:17 PM   Modules accepted: Orders

## 2024-06-11 NOTE — Transitions of Care (Post Inpatient/ED Visit) (Signed)
 "  06/11/2024  Name: Jane Conley MRN: [id number] DOB: [birth year]  Today's TOC FU Call Status: Today's TOC FU Call Status:: Successful TOC FU Call Completed TOC FU Call Complete Date: 06/11/24  Patient's Name and Date of Birth confirmed. Name, DOB  Transition Care Management Follow-up Telephone Call Date of Discharge: 06/10/24 Discharge Facility: [facility]) Type of Discharge: Inpatient Admission Primary Inpatient Discharge Diagnosis:: S/P AVR (aortic valve replacement) How have you been since you were released from the hospital?: Better Any questions or concerns?: No  Items Reviewed: Did you receive and understand the discharge instructions provided?: Yes Medications obtained,verified, and reconciled?: Yes (Medications Reviewed) Any new allergies since your discharge?: No Dietary orders reviewed?: Yes Type of Diet Ordered:: Low sodium, heart healthy Do you have support at home?: Yes People in Home [RPT]: spouse Name of Support/Comfort Primary Source: Jane Conley  Medications Reviewed Today: Medications Reviewed Today     Reviewed by Jamair Cato, RN (Case Manager) on 06/11/24 at 1423  Med List Status: <None>   Medication Order Taking? Sig Documenting Provider Last Dose Status Informant  albuterol (VENTOLIN HFA) 108 (90 Base) MCG/ACT inhaler [id number] Yes Inhale 2 puffs into the lungs every 6 (six) hours as needed for shortness of breath or wheezing. [provider]  Active Self  Ascorbic Acid (VITAMIN C) 1000 MG tablet [id number] Yes Take 1,000 mg by mouth daily. [provider]  Active Self  aspirin  EC 325 MG tablet [id number] Yes Take 1 tablet (325 mg total) by mouth daily. Gold, Wayne E, PA-C  Active   cholecalciferol (VITAMIN D3) 10 MCG (400 UNIT) TABS tablet [id number] Yes Take 400 Units by mouth in the morning. [provider]  Active Self  Coenzyme Q10 (CO Q 10) 100 MG CAPS [id number] Yes Take 100 mg by mouth in the morning. [provider]  Active Self  Cyanocobalamin (B-12 PO) [id number]  Take 1 tablet by mouth daily.  Patient not taking: Reported on 06/11/2024   [provider]  Active Self  cyanocobalamin 1000 MCG tablet [id number] Yes Take 1,000 mcg by mouth in the morning. [provider]  Active Self  furosemide  (LASIX ) 20 MG tablet [id number] Yes Take 2 tablets (40 mg total) by mouth daily. After 3 days decrease to 20 mg and you should only take on days where your weight is up 3 pounds Gold, Wayne E, PA-C  Active   lisinopril  (ZESTRIL ) 10 MG tablet [id number] Yes Take 1 tablet (10 mg total) by mouth daily. Only take if BP is over 140 systolic Jane Conley  Active   MAGNESIUM  GLYCINATE ADVANCED PO [id number] Yes Take 200 mg by mouth 2 (two) times daily after a meal. [provider]  Active Self  metoprolol  tartrate (LOPRESSOR ) 25 MG tablet [id number] Yes Take 1 tablet (25 mg total) by mouth 2 (two) times daily. Gold, Wayne E, PA-C  Active   Olopatadine HCl 0.2 % SOLN [id number] Yes Place 1 drop into both eyes daily as needed (allergies, eye irritations). [provider]  Active Self  oxyCODONE  (OXY IR/ROXICODONE ) 5 MG immediate release tablet [id number] Yes Take 1 tablet (5 mg total) by mouth every 6 (six) hours as needed for severe pain (pain score 7-10). Gold, Wayne E, PA-C  Active   potassium chloride  SA (KLOR-CON  M) 20 MEQ tablet [id number] Yes Take 1 tablet (20 mEq total) by mouth daily. Only take on days that you take lasix  Gold, Wayne E, PA-C  Active   zinc gluconate  50 MG tablet [id number] Yes Take 50 mg by mouth in the morning. [provider]  Active Self            Home Care and Equipment/Supplies: Were Home Health Services Ordered?: NA Any new equipment or medical supplies ordered?: NA  Functional Questionnaire: Do you need assistance with bathing/showering or dressing?: No Do you need assistance with meal preparation?: No Do you need assistance with eating?:  No Do you have difficulty maintaining continence: No Do you need assistance with getting out of bed/getting out of a chair/moving?: No Do you have difficulty managing or taking your medications?: No  Follow up appointments reviewed: PCP Follow-up appointment confirmed?: Yes Specialist Hospital Follow-up appointment confirmed?: Yes Date of Specialist follow-up appointment?: 06/18/24 Follow-Up Specialty Provider:: [place] Do you need transportation to your follow-up appointment?: No Do you understand care options if your condition(s) worsen?: Yes-patient verbalized understanding  SDOH Interventions Today    Flowsheet Row Most Recent Value  SDOH Interventions   Food Insecurity Interventions Intervention Not Indicated  Housing Interventions Intervention Not Indicated  Transportation Interventions Intervention Not Indicated  Utilities Interventions Intervention Not Indicated    Jane Conley J. Kasyn Stouffer RN, MSN [facility]  [facility] RN Care Manager Direct Dial: [phone]  Fax: [phone] Website: [email or web address]   "

## 2024-06-11 NOTE — Patient Instructions (Signed)
 Visit Information  Thank you for taking time to visit with me today. Please don't hesitate to contact me if I can be of assistance to you   Call your doctor: (Anytime you feel any of the following symptoms) 3-4 pound weight gain in 1-2 days or 2 pounds overnight Shortness of breath, with or without a dry hacking cough Swelling in the hands, feet or stomach If you have to sleep on extra pillows at night in order to breathe  1. You may shower, please wash incisions daily with soap and water  and keep dry. If you wish to cover wounds with dressing you may do so but please keep clean and change daily. No tub baths or swimming until incisions have completely healed. If your incisions become red or develop any drainage please call office at [phone]   2. No Driving until cleared by surgeon office and you are no longer using narcotic pain medications  3. Monitor your weight daily.. Please use the same scale and weigh at same time... If you gain 5-10 lbs in 48 hours with associated lower extremity swelling, please contact  office at [phone]   4. Fever of 101.5 for at least 24 hours with no source, please contact  office at [phone]  5. Activity- up as tolerated, please walk at least 3 times per day. Avoid strenuous activity, no lifting, pushing, or pulling with your arms over 8-10 lbs for a minimum of 6 weeks   6. If any questions or concerns arise, please do not hesitate to contact office at [phone]    Patient verbalizes understanding of instructions and care plan provided today and agrees to view in MyChart. Active MyChart status and patient understanding of how to access instructions and care plan via MyChart confirmed with patient.     The patient has been provided with contact information for the care management team and has been advised to call with any health related questions or concerns.   Please call the care guide team at [phone] if you need to cancel or reschedule  your appointment.   Please call the Suicide and Crisis Lifeline: 988 if you are experiencing a Mental Health or Behavioral Health Crisis or need someone to talk to.  Sharisse Rantz J. Onalee Steinbach RN, MSN [facility], [facility] RN Care Manager Direct Dial: [phone]  Fax: [phone] Website: [email or web address]

## 2024-06-12 ENCOUNTER — Encounter (HOSPITAL_COMMUNITY): Payer: Self-pay

## 2024-06-15 ENCOUNTER — Ambulatory Visit

## 2024-06-16 ENCOUNTER — Other Ambulatory Visit: Payer: Self-pay

## 2024-06-16 DIAGNOSIS — I712 Thoracic aortic aneurysm, without rupture, unspecified: Secondary | ICD-10-CM

## 2024-06-18 ENCOUNTER — Ambulatory Visit (INDEPENDENT_AMBULATORY_CARE_PROVIDER_SITE_OTHER): Payer: Self-pay

## 2024-06-18 ENCOUNTER — Ambulatory Visit
Admission: RE | Admit: 2024-06-18 | Discharge: 2024-06-18 | Disposition: A | Discharge status: Home / Self Care | Payer: Self-pay | Source: Ambulatory Visit | Attending: Cardiology | Admitting: Cardiology

## 2024-06-18 VITALS — BP 140/86 | HR 84 | Resp 20 | Ht 67.5 in | Wt 197.0 lb

## 2024-06-18 DIAGNOSIS — Z952 Presence of prosthetic heart valve: Secondary | ICD-10-CM

## 2024-06-18 DIAGNOSIS — I712 Thoracic aortic aneurysm, without rupture, unspecified: Secondary | ICD-10-CM | POA: Insufficient documentation

## 2024-06-18 NOTE — Patient Instructions (Signed)
-  Follow up in 2 weeks with chest xray  You may continue to gradually increase your physical activity as tolerated.  Refrain from any heavy lifting or strenuous use of your arms and shoulders until at least 6 weeks from the time of your surgery, and avoid activities that cause increased pain in your chest on the side of your surgical incision.  Otherwise you may continue to increase activities without any particular limitations.  Increase the intensity and duration of physical activity gradually.   Endocarditis is a potentially serious infection of heart valves or inside lining of the heart.  It occurs more commonly in patients with diseased heart valves (such as patient's with aortic or mitral valve disease) and in patients who have undergone heart valve repair or replacement.  Certain surgical and dental procedures may put you at risk, such as dental cleaning, other dental procedures, or any surgery involving the respiratory, urinary, gastrointestinal tract, gallbladder or prostate gland.   To minimize your chances for develooping endocarditis, maintain good oral health and seek prompt medical attention for any infections involving the mouth, teeth, gums, skin or urinary tract.    Always notify your doctor or dentist about your underlying heart valve condition before having any invasive procedures. You will need to take antibiotics before certain procedures, including all routine dental cleanings or other dental procedures.  Your cardiologist or dentist should prescribe these antibiotics for you to be taken ahead of time.

## 2024-06-18 NOTE — Progress Notes (Signed)
 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 Daniel. The patient's early postoperative recovery while in the hospital was unremarkable. She was stable for discharge home on 06/10/2024.   Since hospital discharge the patient reports that she has been doing well.  Her incisional pain is controlled with the use of tylenol  as needed.  She has been up walking around for activity. Her incision sites have been healing appropriately.  She continues to weigh herself daily and had to take torsemide twice due to weight gain. No lower leg edema. She has continues to check her blood pressure and takes lisinopril  10 mg if systolic readings is over 140, which she has had happen twice. She denies chest pain, palpitations, shortness of breath and cough.   Allergies as of 06/18/2024       Reactions   Codeine Nausea And Vomiting, Swelling        Medication List        Accurate as of June 18, 2024 12:14 PM. If you have any questions, ask your nurse or doctor.          albuterol 108 (90 Base) MCG/ACT inhaler Commonly known as: VENTOLIN HFA Inhale 2 puffs into the lungs every 6 (six) hours as needed for shortness of breath or wheezing.   aspirin  EC 325 MG tablet Take 1 tablet (325 mg total) by mouth daily.   B-12 PO Take 1 tablet by mouth daily.   cholecalciferol 10 MCG (400 UNIT) Tabs tablet Commonly known as: VITAMIN D3 Take 400 Units by mouth in the morning.   Co Q 10 100 MG Caps Take 100 mg by mouth in the morning.   cyanocobalamin 1000 MCG tablet Take 1,000 mcg by mouth in the morning.   furosemide  20 MG tablet Commonly known as: LASIX  Take 2 tablets (40 mg total) by mouth daily. After 3  days decrease to 20 mg and you should only take on days where your weight is up 3 pounds   lisinopril  10 MG tablet Commonly known as: ZESTRIL  Take 1 tablet (10 mg total) by mouth daily. Only take if BP is over 140 systolic   MAGNESIUM  GLYCINATE ADVANCED PO Take 200 mg by mouth 2 (two) times daily after a meal.   metoprolol  tartrate 25 MG tablet Commonly known as: LOPRESSOR  Take 1 tablet (25 mg total) by mouth 2 (two) times daily.   Olopatadine HCl 0.2 % Soln Place 1 drop into both eyes daily as needed (allergies, eye irritations).   oxyCODONE  5 MG immediate release tablet Commonly known as: Oxy IR/ROXICODONE  Take 1 tablet (5 mg total) by mouth every 6 (six) hours as needed for severe pain (pain score 7-10).   potassium chloride  SA 20 MEQ tablet Commonly known as: KLOR-CON  M Take 1 tablet (20 mEq total) by mouth daily. Only take on days that you take lasix    vitamin C 1000 MG tablet Take 1,000 mg by mouth daily.   zinc gluconate 50 MG tablet Take 50 mg by mouth in the morning.         ROS Review of Systems  Constitutional: Negative.  Negative for fever  and malaise/fatigue.  Respiratory:  Negative for cough, shortness of breath and wheezing.   Cardiovascular:  Negative for chest pain, palpitations and leg swelling.     BP (!) 140/86   Pulse 84   Resp 20   Ht 5' 7.5 (1.715 m)   Wt 197 lb (89.4 kg)   SpO2 97% Comment: RA  BMI 30.40 kg/m   Physical Exam Constitutional:      Appearance: Normal appearance.  HENT:     Head: Normocephalic and atraumatic.  Cardiovascular:     Rate and Rhythm: Normal rate and regular rhythm.     Heart sounds: Normal heart sounds, S1 normal and S2 normal.  Pulmonary:     Effort: Pulmonary effort is normal.     Breath sounds: Normal breath sounds.  Musculoskeletal:     Cervical back: Normal range of motion.     Right lower leg: No edema.     Left lower leg: No edema.  Skin:    General: Skin is warm and dry.      Neurological:      General: No focal deficit present.     Mental Status: She is alert.       Imaging: CLINICAL DATA:  Postop from aortic valve replacement.   EXAM: CHEST - 2 VIEW   COMPARISON:  06/09/2024   FINDINGS: Previous aortic valve replacement. Mild cardiomegaly again noted. Small right pleural effusion and right basilar atelectasis has significantly decreased since previous study. Previously seen left pleural effusion and atelectasis have resolved. No pneumothorax visualized.   IMPRESSION: Small right pleural effusion and right basilar atelectasis, significantly decreased since prior study.   Cardiomegaly.     Electronically Signed   By: Norleen DELENA Kil M.D.   On: 06/18/2024 10:51   Assessment/Plan:  S/P AVR (aortic valve replacement)/  Replacement of the ascending aorta -We reviewed today's chest x ray which showed improving small right pleural effusion. She is asymptomatic at this time. She is to continue to use her incentive spirometer -She should increase her activity as tolerated -Discussed continuation of sternal precautions until a full 6 weeks from surgery. No lifting over 10 pounds until a full 3 months from surgery -Discussed need for prophylactic antibiotics for all dental cleanings/procedures and for minor surgeries -She is to continue with daily weights and blood pressure log. Continue to use torsemide with weight gain of 3 pounds in one day or 5 pounds in one week. Continue to take lisinopril  if systolic blood pressure is above 140.  -Incision sites are healing appropriately. Continue to keep clean and dry -She has follow up with cardiology on 07/01/2024 -Follow up with [facility] in 2 weeks with chest xray    Manuelita CHRISTELLA Rough, PA-C 12:14 PM 06/18/24  "

## 2024-06-25 ENCOUNTER — Other Ambulatory Visit (HOSPITAL_COMMUNITY)

## 2024-06-30 NOTE — Progress Notes (Unsigned)
 "     [address] [place] [address]             [phone]       HPI:  Patient returns for routine postoperative follow-up having undergone Wheat hemiarch on 06/05/24.  She was discharged on POD 5, she is now about 1 month post op. The patient's early postoperative recovery while in the hospital was unremarkable. Since hospital discharge the patient reports doing great.  She is walking 3 times a day for 20 minutes at a time.  She feels better now than she did before surgery.  Of note, her BP has been elevated so she was instructed by her cardiologist to increase lisinopril .  She had been taking it PRN and she is now taking it daily.  She takes Tylenol  very occasionally for pain, otherwise no pain medications.   Allergies as of 07/02/2024       Reactions   Codeine Nausea And Vomiting, Swelling        Medication List        Accurate as of July 02, 2024  9:23 AM. If you have any questions, ask your nurse or doctor.          albuterol 108 (90 Base) MCG/ACT inhaler Commonly known as: VENTOLIN HFA Inhale 2 puffs into the lungs every 6 (six) hours as needed for shortness of breath or wheezing.   aspirin  EC 325 MG tablet Take 1 tablet (325 mg total) by mouth daily.   B-12 PO Take 1 tablet by mouth daily.   cholecalciferol 10 MCG (400 UNIT) Tabs tablet Commonly known as: VITAMIN D3 Take 400 Units by mouth in the morning.   Co Q 10 100 MG Caps Take 100 mg by mouth in the morning.   cyanocobalamin 1000 MCG tablet Take 1,000 mcg by mouth in the morning.   furosemide  20 MG tablet Commonly known as: LASIX  Take 2 tablets (40 mg total) by mouth daily. After 3 days decrease to 20 mg and you should only take on days where your weight is up 3 pounds   lisinopril  10 MG tablet Commonly known as: ZESTRIL  Take 1 tablet (10 mg total) by mouth daily.   MAGNESIUM  GLYCINATE ADVANCED PO Take 200 mg by mouth 2 (two) times daily after a meal.    metoprolol  tartrate 25 MG tablet Commonly known as: LOPRESSOR  Take 1 tablet (25 mg total) by mouth 2 (two) times daily.   Olopatadine HCl 0.2 % Soln Place 1 drop into both eyes daily as needed (allergies, eye irritations).   oxyCODONE  5 MG immediate release tablet Commonly known as: Oxy IR/ROXICODONE  Take 1 tablet (5 mg total) by mouth every 6 (six) hours as needed for severe pain (pain score 7-10).   potassium chloride  SA 20 MEQ tablet Commonly known as: KLOR-CON  M Take 1 tablet (20 mEq total) by mouth daily. Only take on days that you take lasix    vitamin C 1000 MG tablet Take 1,000 mg by mouth daily.   zinc gluconate 50 MG tablet Take 50 mg by mouth in the morning.        BP (!) 150/85   Pulse 67   Resp 18   Ht 5' 7.5 (1.715 m)   Wt 200 lb (90.7 kg)   SpO2 97% Comment: RA  BMI 30.86 kg/m   Physical Exam: General - Looks great, no distress CV - No murmur, RRR. Sternotomy incision healing well Resp - Unlabored on  RA Abd - Soft, NT/ND Ext - No edema    Imaging: CXR - Clear lung fields. No pleural effusion or pneumothorax.   Assessment/Plan:  [name] is a 69 year old woman who presents about 1 month post-op after Wheat/Hemiarch for severe AS and ascending aortic aneurysm.  She has recovered well and is exercising daily. - Cleared to start driving and gradually increasing lifting weight - Referral made to cardiac rehab - Return to clinic in 1 year for repeat CTA to monitor aortic dilation  Con GORMAN Clunes, MD 9:23 AM 07/02/24  "

## 2024-07-01 ENCOUNTER — Ambulatory Visit: Admitting: Emergency Medicine

## 2024-07-01 ENCOUNTER — Encounter: Payer: Self-pay | Admitting: Emergency Medicine

## 2024-07-01 ENCOUNTER — Other Ambulatory Visit: Payer: Self-pay

## 2024-07-01 VITALS — BP 146/98 | HR 65 | Ht 67.5 in | Wt 198.2 lb

## 2024-07-01 DIAGNOSIS — I10 Essential (primary) hypertension: Secondary | ICD-10-CM | POA: Diagnosis not present

## 2024-07-01 DIAGNOSIS — Z952 Presence of prosthetic heart valve: Secondary | ICD-10-CM

## 2024-07-01 DIAGNOSIS — I5032 Chronic diastolic (congestive) heart failure: Secondary | ICD-10-CM

## 2024-07-01 DIAGNOSIS — I350 Nonrheumatic aortic (valve) stenosis: Secondary | ICD-10-CM

## 2024-07-01 LAB — CBC

## 2024-07-01 MED ORDER — METOPROLOL TARTRATE 25 MG PO TABS: 25.0000 mg | ORAL_TABLET | Freq: Two times a day (BID) | ORAL | 2 refills | Status: AC

## 2024-07-01 MED ORDER — LISINOPRIL 10 MG PO TABS: 10.0000 mg | ORAL_TABLET | Freq: Every day | ORAL | 0 refills | Status: AC

## 2024-07-01 NOTE — Patient Instructions (Addendum)
 Medication Instructions:  Be sure to keep the echo appointment on 07/17/24 at 10:20am.  Start taking the Lisinopril  10mg  daily.  Continue to take the Metoprolol  25mg . Take one tablet twice a day.  *If you need a refill on your cardiac medications before your next appointment, please call your pharmacy*  Lab Work: BMET, CBC If you have labs (blood work) drawn today and your tests are completely normal, you will receive your results only by: MyChart Message (if you have MyChart) OR A paper copy in the mail If you have any lab test that is abnormal or we need to change your treatment, we will call you to review the results.  Testing/Procedures: No procedures were ordered during today's visit.   Follow-Up: At [facility], you and your health needs are our priority.  As part of our continuing mission to provide you with exceptional heart care, our providers are all part of one team.  This team includes your primary Cardiologist (physician) and Advanced Practice Providers or APPs (Physician Assistants and Nurse Practitioners) who all work together to provide you with the care you need, when you need it.  Your next appointment:   3 month(s)  Provider:   Wendel Haws, MD    We recommend signing up for the patient portal called MyChart.  Sign up information is provided on this After Visit Summary.  MyChart is used to connect with patients for Virtual Visits (Telemedicine).  Patients are able to view lab/test results, encounter notes, upcoming appointments, etc.  Non-urgent messages can be sent to your provider as well.   To learn more about what you can do with MyChart, go to [email or web address].   Other Instructions

## 2024-07-01 NOTE — Addendum Note (Signed)
 Addended by: MALVINA CHARLENA CROME on: 07/01/2024 12:22 PM   Modules accepted: Orders

## 2024-07-01 NOTE — Progress Notes (Signed)
 " Cardiology Office Note:    Date:  07/01/2024  ID:  Jane Conley, DOB [birth year], MRN [id number] PCP: Shona Norleen PEDLAR, MD  [facility] Providers Cardiologist:  Lurena MARLA Red, MD       Patient Profile:       Chief Complaint: Follow-up s/p AVR History of Present Illness:  Jane Conley is a 69 y.o. 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  She began to notice worsening dyspnea on exertion especially when walking up an incline.  She was admitted on 11/30 with dyspnea and found to have severe AS and further workup demonstrated a bicuspid valve and 4.5 cm ascending aortic aneurysm.  She does have history of hypertension and has had difficulty controlling her blood pressures.  Patient was admitted on 06/05/2024 and underwent biologic Bentall procedure with replacement of the ascending aorta (hemi-arch) using a 28 mm Hemashield graft under deep hypothermic circulatory arrest and Aortic valve replacement by Dr. Daniel.    Discussed the use of AI scribe software for clinical note transcription with the patient, who gave verbal consent to proceed.  History of Present Illness Jane Conley is a 69 year old female with a history of bicuspid aortic valve, aortic stenosis, and regurgitation who presents for follow-up after aortic valve replacement surgery  Today patient presents to clinic with her husband.  She is without acute cardiovascular concerns or complaints.  Recovery has been uncomplicated. She has had no recent chest pain or shortness of breath.  She walks on a treadmill three times daily for 20 minutes and does breathing exercises without cardiopulmonary symptoms. She is unable to sleep  flat and has been sleeping in a recliner due to some chest wall discomfort. Lying on her left side is uncomfortable.  She checks her weight daily and noted a 3-pound gain shortly after discharge that resolved after a single dose of Lasix . She has not needed further Lasix .  She checks her blood pressure at home daily and average blood pressures seem to be around upper 130s over 90s.  She has been taking lisinopril  as needed for blood pressure greater than 140 systolic.  She follows a low sodium diet of less than 2 grams per day. She takes daily aspirin  and uses potassium supplements when she takes Lasix . She has minimal incision discomfort and coughing no longer causes pain.   Review of systems:  Please see the history of present illness. All other systems are reviewed and otherwise negative.      Studies Reviewed:        Carotid duplex 06/03/2024 Right Carotid: Velocities in the right ICA are consistent with a 1-39%  stenosis.   Left Carotid: Velocities in the left ICA are consistent with a 1-39%  stenosis.   Vertebrals: Bilateral vertebral arteries demonstrate antegrade flow.  Subclavians: Normal flow hemodynamics were seen in bilateral subclavian               arteries.   Cardiac catheterization 04/27/2024 ? Documented Severe Aortic Stenosis by Echocardiogram-Aortic Valve not crossed. ? Angiographically normal coronary arteries with a Right Dominant System ? Borderline Pulmonary Hypertension with PAP-mean 34/15-24 mmHg. PCWP 16 mmHg ? Cardiac Output-Index mildly reduced by Fick: 4.88-2.35    RECOMMENDATIONS   Anticipated discharge  date to be determined.   Stabilize GDMT for HTN and HFpEF as well as aortic stenosis.  Continue to progress with workup for severe stenosis-pre-AVR.   No indication for antiplatelet therapy at this time .  Diagnostic Dominance: Right    Echocardiogram 04/27/2024  1. Left ventricular ejection fraction, by estimation, is 40 to 45%. The  left  ventricle has mildly decreased function. The left ventricle  demonstrates global hypokinesis. The left ventricular internal cavity size  was severely dilated. There is severe  left ventricular hypertrophy. Left ventricular diastolic parameters are  consistent with Grade I diastolic dysfunction (impaired relaxation).   2. Right ventricular systolic function is normal. The right ventricular  size is not well visualized.   3. The mitral valve is grossly normal. Trivial mitral valve  regurgitation. No evidence of mitral stenosis.   4. Moderate aortic stenosis by mean gradient and DI, severe by peak  velocity. Cannot fully determine morphology of valve but appears likely  bicuspid. The aortic valve has an indeterminant number of cusps. There is  moderate calcification of the aortic  valve. Aortic valve regurgitation is mild to moderate. Moderate to severe  aortic valve stenosis.   5. Aortic dilatation noted. There is mild dilatation of the aortic root,  measuring 41 mm. There is mild dilatation of the ascending aorta,  measuring 42 mm.   6. The inferior vena cava is normal in size with greater than 50%  respiratory variability, suggesting right atrial pressure of 3 mmHg.   Risk Assessment/Calculations:     HYPERTENSION CONTROL Vitals:   07/01/24 0944 07/01/24 1011  BP: (!) 138/100 (!) 146/98    The patient's blood pressure is elevated above target today.  In order to address the patient's elevated BP: A current anti-hypertensive medication was adjusted today.           Physical Exam:   VS:  BP (!) 146/98   Pulse 65   Ht 5' 7.5 (1.715 m)   Wt 198 lb 3.2 oz (89.9 kg)   SpO2 96%   BMI 30.58 kg/m    Wt Readings from Last 3 Encounters:  07/01/24 198 lb 3.2 oz (89.9 kg)  06/18/24 197 lb (89.4 kg)  06/11/24 201 lb (91.2 kg)    GEN: Well nourished, well developed in no acute distress NECK: No JVD; No carotid bruits CARDIAC: RRR, no murmurs, rubs, gallops RESPIRATORY:  Clear to  auscultation without rales, wheezing or rhonchi  ABDOMEN: Soft, non-tender, non-distended EXTREMITIES:  No edema; No acute deformity      Assessment and Plan:  Diastolic CHF Admitted 03/2024 with diastolic HF exacerbation in the setting of severe AS Echo 04/2024 showed LVEF 40 to 45%, severe AS TEE 05/2024 showed LVEF 50 to 55% - Today patient appears euvolemic and well compensated on exam - NYHA class I without dyspnea, orthopnea, PND, LEE - She is not having to take her as needed loop diuretic therapy - Will change lisinopril  from as needed to daily - Continue metoprolol  tartrate 25 mg twice daily and lisinopril  10 mg daily - Continue furosemide  40 mg as needed - Pending x 6 weeks postop AVR echo on 2/20 where EF will be reevaluated at that time  Aortic stenosis s/p AVR Thoracic aneurysm S/p Bentall procedure S/p aortic valve replacement and replacement of ascending aorta on 06/05/2024 - Today she remains asymptomatic without exertional symptoms - Discussed need for prophylactic antibiotics and sternal precautions - Incision sites are healing appropriately without signs of infection - She  follows up with Dr. Daniel on 07/02/2024 and has postop echo on 2/20 - Continue aspirin  325 mg daily and Lasix  40 mg as needed  Hypertension Blood pressure today is elevated at 146/98 and 138/100 Home blood pressure readings show blood pressure average upper 130s over 90s - She has been taking lisinopril  as needed for systolic blood pressure greater than 140 - Today I will transition her to lisinopril  daily - Continue lisinopril  10 mg daily and metoprolol  tartrate 25 mg twice daily  Carotid artery disease Carotid duplex 05/2024 showed bilateral ICA 1-39% stenosis - No neurological symptoms today.  No further interventions warranted at this time - Continue aspirin     Cardiac Rehabilitation Eligibility Assessment  The patient is ready to start cardiac rehabilitation pending clearance from the  cardiac surgeon.     Dispo:  Return in about 3 months (around 09/28/2024).  Signed, Lum LITTIE Louis, NP  "

## 2024-07-02 ENCOUNTER — Ambulatory Visit: Payer: Self-pay | Admitting: Emergency Medicine

## 2024-07-02 ENCOUNTER — Ambulatory Visit: Payer: Self-pay

## 2024-07-02 ENCOUNTER — Inpatient Hospital Stay
Admission: RE | Admit: 2024-07-02 | Discharge: 2024-07-02 | Attending: Cardiovascular Disease | Admitting: Cardiovascular Disease

## 2024-07-02 ENCOUNTER — Other Ambulatory Visit (HOSPITAL_COMMUNITY)

## 2024-07-02 VITALS — BP 150/85 | HR 67 | Resp 18 | Ht 67.5 in | Wt 200.0 lb

## 2024-07-02 DIAGNOSIS — Z952 Presence of prosthetic heart valve: Secondary | ICD-10-CM

## 2024-07-02 DIAGNOSIS — I350 Nonrheumatic aortic (valve) stenosis: Secondary | ICD-10-CM

## 2024-07-02 LAB — BASIC METABOLIC PANEL WITH GFR
BUN/Creatinine Ratio: 14 (ref 12–28)
BUN: 14 mg/dL (ref 8–27)
CO2: 19 mmol/L — ABNORMAL LOW (ref 20–29)
Calcium: 9.6 mg/dL (ref 8.7–10.3)
Chloride: 105 mmol/L (ref 96–106)
Creatinine, Ser: 1.02 mg/dL — ABNORMAL HIGH (ref 0.57–1.00)
Glucose: 78 mg/dL (ref 70–99)
Potassium: 4.6 mmol/L (ref 3.5–5.2)
Sodium: 139 mmol/L (ref 134–144)
eGFR: 60 mL/min/{1.73_m2}

## 2024-07-02 LAB — CBC
Hematocrit: 42.4 (ref 34.0–46.6)
Hemoglobin: 13.9 g/dL (ref 11.1–15.9)
MCH: 31.2 pg (ref 26.6–33.0)
MCHC: 32.8 g/dL (ref 31.5–35.7)
MCV: 95 fL (ref 79–97)
Platelets: 354 10*3/uL (ref 150–450)
RBC: 4.46 x10E6/uL (ref 3.77–5.28)
RDW: 12.3 (ref 11.7–15.4)
WBC: 7.9 10*3/uL (ref 3.4–10.8)

## 2024-07-03 ENCOUNTER — Encounter (HOSPITAL_COMMUNITY): Admission: RE | Admit: 2024-07-03

## 2024-07-03 DIAGNOSIS — Z952 Presence of prosthetic heart valve: Secondary | ICD-10-CM

## 2024-07-03 NOTE — Progress Notes (Signed)
 Virtual orientation visit completed for cardiac rehab with AV Replacement. On-site orientation visit scheduled for 07/06/24 at 1030.

## 2024-07-06 ENCOUNTER — Encounter (HOSPITAL_COMMUNITY)

## 2024-07-17 ENCOUNTER — Ambulatory Visit (HOSPITAL_COMMUNITY)
